# Patient Record
Sex: Female | Born: 1955
Health system: Southern US, Community
[De-identification: ages and names within clinical notes are randomized; demographics above are authoritative.]

## PROBLEM LIST (undated history)

## (undated) DIAGNOSIS — R413 Other amnesia: Secondary | ICD-10-CM

## (undated) HISTORY — DX: Other amnesia: R41.3

## (undated) HISTORY — PX: TONSILLECTOMY: SUR1361

## (undated) HISTORY — PX: TUBAL LIGATION: SHX77

---

## 2006-02-14 ENCOUNTER — Ambulatory Visit (HOSPITAL_COMMUNITY): Admission: RE | Admit: 2006-02-14 | Discharge: 2006-02-14 | Payer: Self-pay | Admitting: Obstetrics and Gynecology

## 2006-03-04 ENCOUNTER — Encounter: Admission: RE | Admit: 2006-03-04 | Discharge: 2006-03-04 | Payer: Self-pay | Admitting: Obstetrics and Gynecology

## 2007-02-18 ENCOUNTER — Ambulatory Visit (HOSPITAL_COMMUNITY): Admission: RE | Admit: 2007-02-18 | Discharge: 2007-02-18 | Payer: Self-pay | Admitting: Obstetrics and Gynecology

## 2008-02-22 ENCOUNTER — Ambulatory Visit (HOSPITAL_COMMUNITY): Admission: RE | Admit: 2008-02-22 | Discharge: 2008-02-22 | Payer: Self-pay | Admitting: Obstetrics and Gynecology

## 2008-04-05 ENCOUNTER — Encounter: Admission: RE | Admit: 2008-04-05 | Discharge: 2008-04-05 | Payer: Self-pay | Admitting: Obstetrics and Gynecology

## 2008-11-28 ENCOUNTER — Ambulatory Visit: Payer: Self-pay | Admitting: Vascular Surgery

## 2009-02-22 ENCOUNTER — Ambulatory Visit (HOSPITAL_COMMUNITY): Admission: RE | Admit: 2009-02-22 | Discharge: 2009-02-22 | Payer: Self-pay | Admitting: Obstetrics and Gynecology

## 2009-02-27 ENCOUNTER — Ambulatory Visit: Payer: Self-pay | Admitting: Vascular Surgery

## 2009-04-10 ENCOUNTER — Ambulatory Visit: Payer: Self-pay | Admitting: Vascular Surgery

## 2009-04-17 ENCOUNTER — Ambulatory Visit: Payer: Self-pay | Admitting: Vascular Surgery

## 2010-03-07 ENCOUNTER — Ambulatory Visit (HOSPITAL_COMMUNITY): Admission: RE | Admit: 2010-03-07 | Discharge: 2010-03-07 | Payer: Self-pay | Admitting: Obstetrics and Gynecology

## 2010-04-19 ENCOUNTER — Ambulatory Visit (HOSPITAL_COMMUNITY): Admission: RE | Admit: 2010-04-19 | Discharge: 2010-04-19 | Payer: Self-pay | Admitting: Obstetrics and Gynecology

## 2010-09-23 ENCOUNTER — Encounter: Payer: Self-pay | Admitting: Obstetrics and Gynecology

## 2011-01-15 NOTE — Assessment & Plan Note (Signed)
OFFICE VISIT   [place], Beckett R  DOB:  [birth year]                                       02/27/2009  ZOXWR#:[id number]   The patient returns today for further followup regarding her severe  venous insufficiency of both lower extremities.  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  She has been wearing long-leg elastic compression  stockings as well as elevating her legs as much as possible and taking  ibuprofen.  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  She continues to have symptoms which are  affecting her daily living and her ability to work.   PHYSICAL EXAMINATION:  She continues to have bulging varicosities in  both distal thigh and calf areas with 1+ edema distally and significant  hyperpigmentation and lipodermatosclerosis, particularly in the right  ankle.  Her duplex study done at last visit revealed gross reflux in  both great saphenous veins from the saphenofemoral junction to the knee  with normal deep systems.   I think we should proceed with (1) laser ablation of the right great  saphenous vein with multiple stab phlebectomies to be followed by (2)  laser ablation of the left great saphenous vein with multiple stab  phlebectomies to decrease her venous pressure and eliminate her  symptoms.  We will proceed with precertification for this procedure to  be done in the near future.   Quita Skye Hart Rochester, M.D.  Electronically Signed   JDL/MEDQ  D:  02/27/2009  T:  02/28/2009  Job:  [id number]

## 2011-01-15 NOTE — Procedures (Signed)
LOWER EXTREMITY VENOUS REFLUX EXAM   INDICATION:  Bilateral lower extremity varicose veins.   EXAM:  Using color-flow imaging and pulse Doppler spectral analysis, the  right and left common femoral, superficial femoral, popliteal, posterior  tibial, greater and lesser saphenous veins are evaluated.  There is  evidence suggesting deep venous insufficiency in the right and left  lower extremities.   The right and left saphenofemoral junctions are not competent.   The right and left GSV are not competent, with the caliber as described  below.   The right proximal short saphenous vein demonstrates competency.  The  left proximal short saphenous vein appears to be thrombosed.   GSV Diameter (used if found to be incompetent only)                                            Right    Left  Proximal Greater Saphenous Vein           0.78 cm  0.65 cm  Proximal-to-mid-thigh                     0.44 cm  0.48 cm  Mid thigh                                 0.45 cm  0.4 cm  Mid-distal thigh                          0.4 cm   0.4 cm  Distal thigh                              0.41 cm  0.56 cm  Knee                                      0.7 cm   0.21 cm   RIGHT MEDIAL BRANCH OF LEFT GREATER SAPHENOUS VEIN DISTAL THIGH:  0.37  cm   POPLITEAL LEVEL:  0.4 cm   IMPRESSION:  1. Right and left greater saphenous vein reflux is identified with the      caliber ranging from 0.4 cm to 0.8 cm from the knee to the groin on      the right, 0.65 cm to 0.21 cm on the left.  2. The right and left greater saphenous veins are not aneurysmal.  3. The right and left greater saphenous veins are not tortuous.  4. The deep venous system is not competent throughout the legs      bilaterally and appears somewhat pulsatile.  5. The left lesser saphenous vein is thrombosed.  6. At the distal thigh the left greater saphenous vein branches.  The      more posterior and medial branch is the larger and  straighter of      the two.   ___________________________________________  Quita Skye. Hart Rochester, M.D.   MC/MEDQ  D:  11/28/2008  T:  11/28/2008  Job:  [id number]

## 2011-01-15 NOTE — Consult Note (Signed)
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  She has  not had a stasis ulcer but the skin on the right ankle has changed  dramatically.  She has aching, burning and having discomfort in both  legs in the lower thigh and calves as the day progresses.  This can be  relieved by elevation but she is unable to elevate her legs since she  stands most of the day at work.  She does have swelling in the ankles  and foot as the day progresses, has no history of bleeding, stasis  ulcers, thrombophlebitis or deep venous thrombosis.  She has not worn  elastic compression stockings since her last pregnancy and does not take  pain medicine.   PAST MEDICAL HISTORY:  Negative for diabetes, hypertension, coronary  artery disease, COPD or stroke.   PAST SURGICAL HISTORY:  1. Tonsillectomy.  2. Tubal ligation.   FAMILY HISTORY:  Positive for hypertension in her mother, coronary  artery disease in her grandfather and 2 strokes in the other  grandfather.  Negative for diabetes.   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   REVIEW OF SYSTEMS:  Unremarkable.  No chest pain, dyspnea on exertion,  PND, orthopnea, has occasional joint pain in her knees, otherwise  negative.   ALLERGIES:  Sulfa.   PHYSICAL EXAMINATION:  Vital Signs:  Blood pressure 110/70, heart rate  68, respirations 14.  General:  She is a healthy middle-aged female in  no apparent distress, alert and oriented x3.  Neck:  Supple, 3+  carotid  pulses palpable.  No bruits are audible.  Neurologic:  Normal.  No  palpable adenopathy in the neck.  Chest:  Clear to auscultation.  Cardiovascular:  Regular rhythm, no murmurs.  Abdomen:  Soft, nontender  with no masses.  She has 3+ femoral, popliteal and distal pulses  bilaterally.  Both feet are well-perfused.  Right leg has severe venous  insufficiency of the greater saphenous system with varicosity in the  distal thigh but a large cluster in the medial calf over the greater  saphenous system with prominent reticular and spider veins extending  over the lower third of the leg into the ankle and foot.  She has  lipodermatosclerosis in the right ankle with skin changes and  hyperpigmentation but no active ulcer.  There is 1+ edema on the right  side.  Left leg has similar findings, but less severe, with prominent  varicosity in the left medial thigh distally in the left medial calf  with spider and reticular veins in the ankle region and some  hyperpigmentation.   Venous duplex exam in our office today reveals mild reflux in the deep  systems bilaterally.  Both great saphenous systems have diffuse reflux  from the saphenofemoral junction down to the knee with valvular  incompetence.  This patient does have severe symptoms of venous insufficiency.  We will  treat her initially with long-leg elastic compression stockings (20-mm -  30-mm), which we fitted her for today, as well as elevation of her legs  as much as possible and ibuprofen for discomfort.  I will see her in 3  months to see how this progresses.  If she has had no improvement, she  would be a good candidate for 1)  Laser ablation of the right great  saphenous vein with multiple stab phlebectomies, 2) Laser ablation of  the left great saphenous vein with multiple stab phlebectomies.   Quita Skye Hart Rochester, M.D.  Electronically Signed  JDL/MEDQ  D:  11/28/2008  T:  11/28/2008  Job:  [id number]   cc:   Kristen Norman,  M.D.

## 2011-01-15 NOTE — Procedures (Signed)
DUPLEX DEEP VENOUS EXAM - LOWER EXTREMITY    INDICATION:  Follow up right greater saphenous vein laser ablation.   HISTORY:  Edema:  No.  Trauma/Surgery:  Right greater saphenous vein ablation, 04/10/09 by Dr.  Hart Rochester.  Pain:  Right lower extremity tightness.  PE:  No.  Previous DVT:  No.  Anticoagulants:  No.  Other:   DUPLEX EXAM:                CFV   SFV   PopV   PTV   GSV                R  L  R  L  R  L   R  L  R  L  Thrombosis    o  o  o     o      o     +  Spontaneous   +  +  +     +      +     0  Phasic        +  +  +     +      +     0  Augmentation  +  +  +     +      +     0  Compressible  +  +  +     +      +     0  Competent     D  D  D     +      +     0   Legend:  + - yes  o - no  p - partial  D - decreased   IMPRESSION:  1. No evidence of deep venous thrombosis in the right lower extremity      or left common femoral vein.  2. Right greater saphenous vein shows evidence of ablation without      flow from groin to knee.    _____________________________  Quita Skye Hart Rochester, M.D.   AS/MEDQ  D:  04/17/2009  T:  04/17/2009  Job:  [id number]

## 2011-01-15 NOTE — Assessment & Plan Note (Signed)
OFFICE VISIT   Kristen Norman, Kristen Norman  DOB:  23-Mar-1956                                       04/17/2009  UYQIH#:[id number]   The patient had laser ablation of her right great saphenous vein with  multiple stab phlebectomies 1 week ago for painful varicosities in the  right thigh and calf and skin changes in the right ankle secondary to  venous hypertension.  She is done well, with some mild discomfort in the  thigh in the area of the laser ablation.  She did have some localized  swelling in the right medial calf a few days following her procedure  where a stab phlebectomy was performed, but that has resolved with  external compression.  On exam, the stab phlebectomy wounds are all  healing adequately and there is some resolving erythema in the thigh  overlying the right great saphenous vein.  Duplex scan today reveals  total occlusion of the left great saphenous vein from the saphenofemoral  junction to the knee with no DVT.  She is reassured regarding these  findings, and will schedule her contralateral left leg to be done in the  next several months.   Quita Skye Hart Rochester, M.D.  Electronically Signed   JDL/MEDQ  D:  04/17/2009  T:  04/18/2009  Job:  [id number]

## 2011-02-19 ENCOUNTER — Other Ambulatory Visit (HOSPITAL_COMMUNITY): Payer: Self-pay | Admitting: Obstetrics & Gynecology

## 2011-02-19 DIAGNOSIS — Z1231 Encounter for screening mammogram for malignant neoplasm of breast: Secondary | ICD-10-CM

## 2011-03-11 ENCOUNTER — Ambulatory Visit (HOSPITAL_COMMUNITY)
Admission: RE | Admit: 2011-03-11 | Discharge: 2011-03-11 | Disposition: A | Discharge status: Home / Self Care | Payer: BC Managed Care – PPO | Source: Ambulatory Visit | Attending: Obstetrics & Gynecology | Admitting: Obstetrics & Gynecology

## 2011-03-11 DIAGNOSIS — Z1231 Encounter for screening mammogram for malignant neoplasm of breast: Secondary | ICD-10-CM

## 2012-02-26 ENCOUNTER — Other Ambulatory Visit (HOSPITAL_COMMUNITY): Payer: Self-pay | Admitting: Obstetrics & Gynecology

## 2012-02-26 DIAGNOSIS — Z1231 Encounter for screening mammogram for malignant neoplasm of breast: Secondary | ICD-10-CM

## 2012-03-16 ENCOUNTER — Ambulatory Visit (HOSPITAL_COMMUNITY)
Admission: RE | Admit: 2012-03-16 | Discharge: 2012-03-16 | Disposition: A | Discharge status: Home / Self Care | Payer: BC Managed Care – PPO | Source: Ambulatory Visit | Attending: Obstetrics & Gynecology | Admitting: Obstetrics & Gynecology

## 2012-03-16 DIAGNOSIS — Z1231 Encounter for screening mammogram for malignant neoplasm of breast: Secondary | ICD-10-CM

## 2012-06-13 IMAGING — MG MM DIGITAL SCREENING
2 series · 2 of 2 positions shown · non-contrast
Comparison: none

DG SCREEN MAMMOGRAM BILATERAL
Bilateral CC and MLO view(s) were taken.
Technologist: Dionisia Yt, RT, RM

DIGITAL SCREENING MAMMOGRAM WITH CAD:
The breast tissue is heterogeneously dense.  No masses or malignant type calcifications are 
identified.  Compared with prior studies.
Images were processed with CAD.

[R CC]
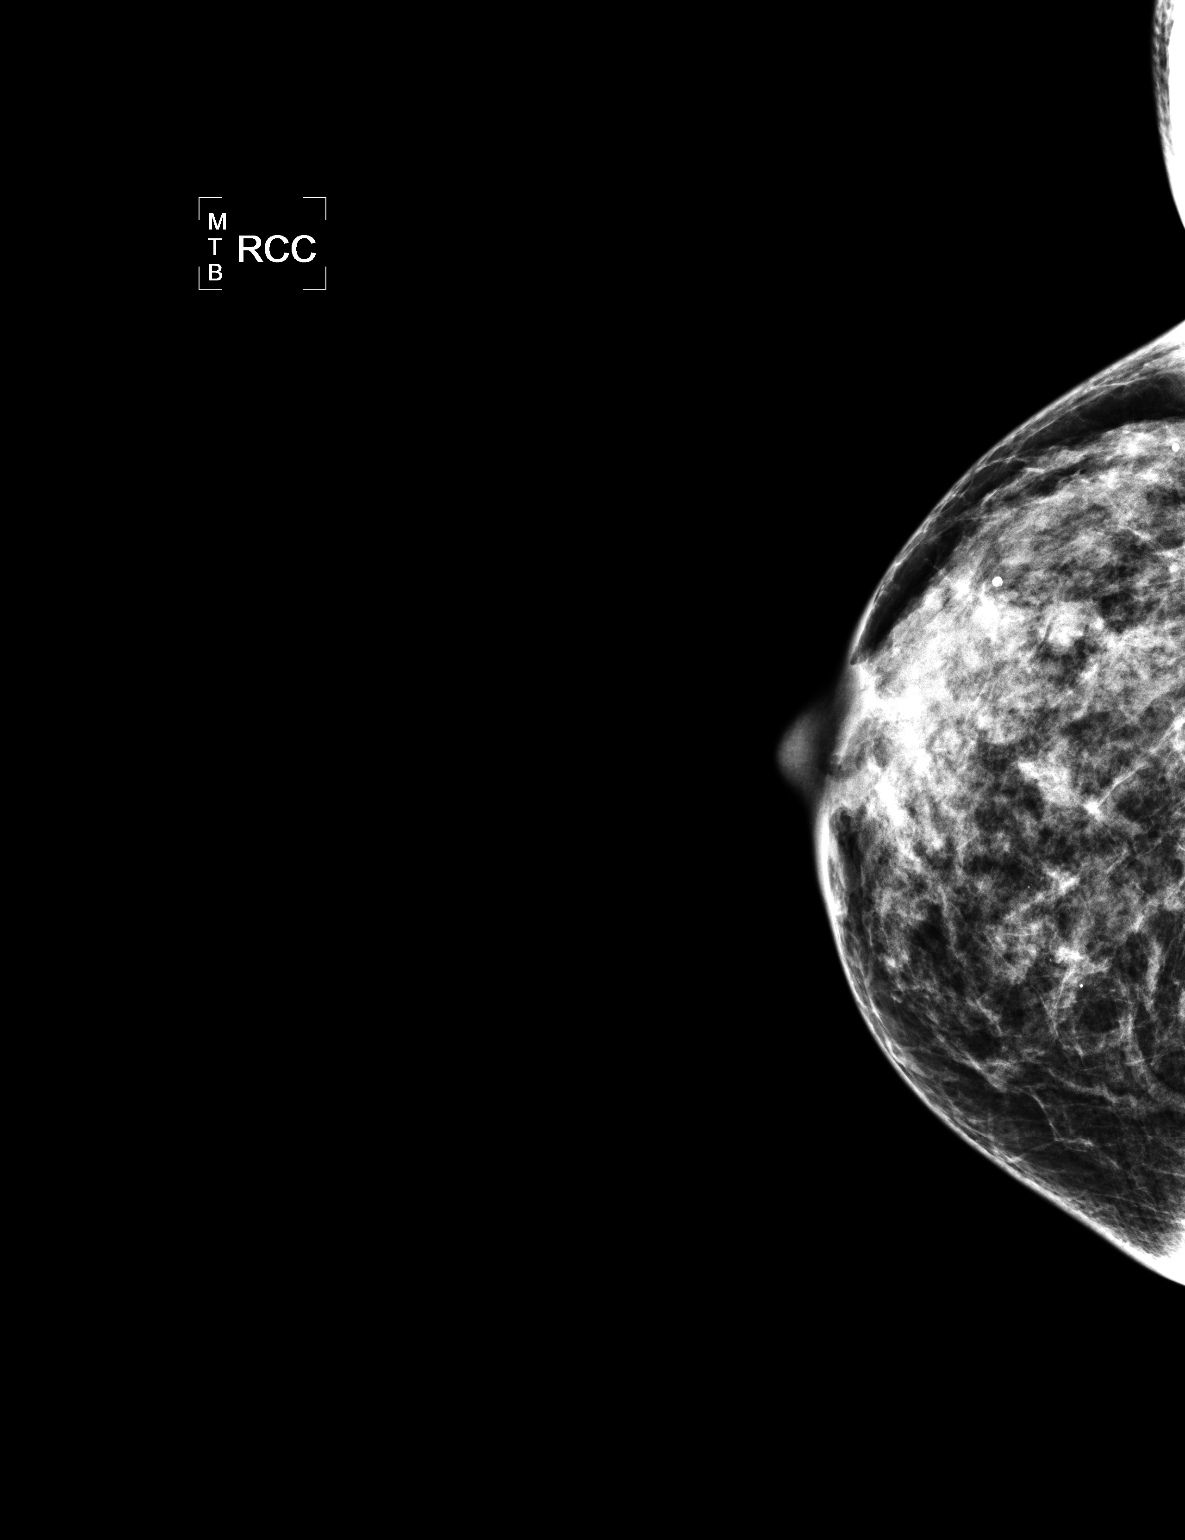

[R MLO]
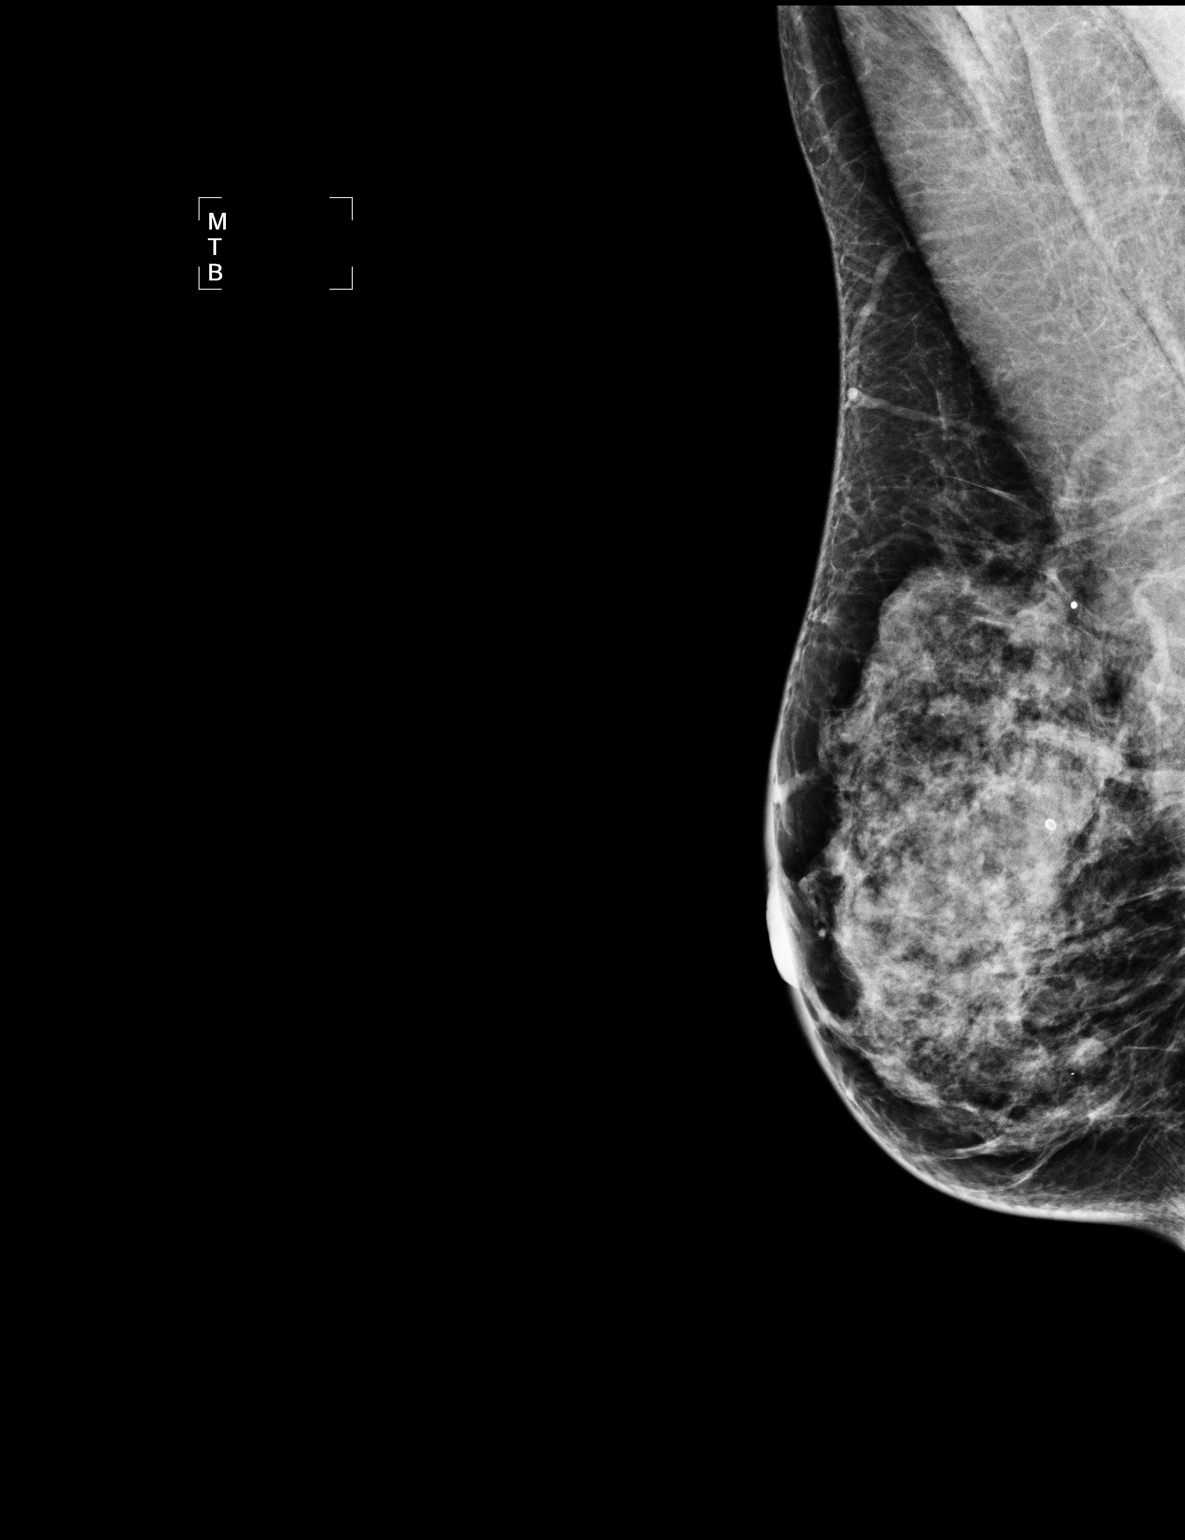

[2 of 2 positions shown; findings below may reference images not displayed]

IMPRESSION: No specific mammographic evidence of malignancy.  Next screening mammogram is recommended in one 
year.

A result letter of this screening mammogram will be mailed directly to the patient.

ASSESSMENT: Negative - BI-RADS 1

Screening mammogram in 1 year.
,

## 2013-02-11 ENCOUNTER — Other Ambulatory Visit (HOSPITAL_COMMUNITY): Payer: Self-pay | Admitting: Obstetrics & Gynecology

## 2013-02-11 DIAGNOSIS — Z1231 Encounter for screening mammogram for malignant neoplasm of breast: Secondary | ICD-10-CM

## 2013-03-29 ENCOUNTER — Ambulatory Visit (HOSPITAL_COMMUNITY)
Admission: RE | Admit: 2013-03-29 | Discharge: 2013-03-29 | Disposition: A | Discharge status: Home / Self Care | Payer: BC Managed Care – PPO | Source: Ambulatory Visit | Attending: Obstetrics & Gynecology | Admitting: Obstetrics & Gynecology

## 2013-03-29 DIAGNOSIS — Z1231 Encounter for screening mammogram for malignant neoplasm of breast: Secondary | ICD-10-CM | POA: Insufficient documentation

## 2013-12-31 ENCOUNTER — Other Ambulatory Visit: Payer: Self-pay | Admitting: Cardiology

## 2013-12-31 ENCOUNTER — Ambulatory Visit
Admission: RE | Admit: 2013-12-31 | Discharge: 2013-12-31 | Disposition: A | Discharge status: Home / Self Care | Payer: BC Managed Care – PPO | Source: Ambulatory Visit | Attending: Cardiology | Admitting: Cardiology

## 2013-12-31 DIAGNOSIS — R0789 Other chest pain: Secondary | ICD-10-CM

## 2014-02-16 ENCOUNTER — Other Ambulatory Visit (HOSPITAL_COMMUNITY): Payer: Self-pay | Admitting: Obstetrics & Gynecology

## 2014-02-16 DIAGNOSIS — Z1231 Encounter for screening mammogram for malignant neoplasm of breast: Secondary | ICD-10-CM

## 2014-03-31 ENCOUNTER — Ambulatory Visit (HOSPITAL_COMMUNITY)
Admission: RE | Admit: 2014-03-31 | Discharge: 2014-03-31 | Disposition: A | Discharge status: Home / Self Care | Payer: BC Managed Care – PPO | Source: Ambulatory Visit | Attending: Obstetrics & Gynecology | Admitting: Obstetrics & Gynecology

## 2014-03-31 DIAGNOSIS — Z1231 Encounter for screening mammogram for malignant neoplasm of breast: Secondary | ICD-10-CM | POA: Insufficient documentation

## 2015-03-15 ENCOUNTER — Other Ambulatory Visit (HOSPITAL_COMMUNITY): Payer: Self-pay | Admitting: Obstetrics & Gynecology

## 2015-03-15 DIAGNOSIS — Z1231 Encounter for screening mammogram for malignant neoplasm of breast: Secondary | ICD-10-CM

## 2015-04-03 ENCOUNTER — Ambulatory Visit (HOSPITAL_COMMUNITY)
Admission: RE | Admit: 2015-04-03 | Discharge: 2015-04-03 | Disposition: A | Discharge status: Home / Self Care | Payer: BC Managed Care – PPO | Source: Ambulatory Visit | Attending: Obstetrics & Gynecology | Admitting: Obstetrics & Gynecology

## 2015-04-03 DIAGNOSIS — Z1231 Encounter for screening mammogram for malignant neoplasm of breast: Secondary | ICD-10-CM

## 2017-03-26 ENCOUNTER — Other Ambulatory Visit: Payer: Self-pay | Admitting: Obstetrics & Gynecology

## 2017-03-26 DIAGNOSIS — Z1231 Encounter for screening mammogram for malignant neoplasm of breast: Secondary | ICD-10-CM

## 2017-04-14 ENCOUNTER — Telehealth: Payer: Self-pay | Admitting: *Deleted

## 2017-04-14 ENCOUNTER — Encounter: Payer: Self-pay | Admitting: Obstetrics & Gynecology

## 2017-04-14 DIAGNOSIS — Z131 Encounter for screening for diabetes mellitus: Secondary | ICD-10-CM

## 2017-04-14 DIAGNOSIS — Z13220 Encounter for screening for lipoid disorders: Secondary | ICD-10-CM

## 2017-04-14 DIAGNOSIS — Z1321 Encounter for screening for nutritional disorder: Secondary | ICD-10-CM

## 2017-04-14 DIAGNOSIS — Z1329 Encounter for screening for other suspected endocrine disorder: Secondary | ICD-10-CM

## 2017-04-14 DIAGNOSIS — Z01419 Encounter for gynecological examination (general) (routine) without abnormal findings: Secondary | ICD-10-CM

## 2017-04-14 NOTE — Telephone Encounter (Signed)
Pt coming on in am at 8:45am, order placed. Pt will be fasting.

## 2017-04-14 NOTE — Telephone Encounter (Signed)
Pt has annual exam tomorrow at 2pm wanted to have labs done in am for fasting you ordered Hemoglobin A1C, CMP, CBC,FLP, Vitamin D, TSH last year, okay to order same this year? Or wait until annual visit. Please advise

## 2017-04-14 NOTE — Telephone Encounter (Signed)
Yes agree with same labs.

## 2017-04-15 ENCOUNTER — Other Ambulatory Visit: Payer: BC Managed Care – PPO

## 2017-04-15 ENCOUNTER — Ambulatory Visit
Admission: RE | Admit: 2017-04-15 | Discharge: 2017-04-15 | Disposition: A | Discharge status: Home / Self Care | Payer: BC Managed Care – PPO | Source: Ambulatory Visit | Attending: Obstetrics & Gynecology | Admitting: Obstetrics & Gynecology

## 2017-04-15 ENCOUNTER — Ambulatory Visit (INDEPENDENT_AMBULATORY_CARE_PROVIDER_SITE_OTHER): Payer: BC Managed Care – PPO | Admitting: Obstetrics & Gynecology

## 2017-04-15 ENCOUNTER — Encounter: Payer: Self-pay | Admitting: Obstetrics & Gynecology

## 2017-04-15 VITALS — BP 112/70 | Ht 65.0 in | Wt 115.0 lb

## 2017-04-15 DIAGNOSIS — Z01419 Encounter for gynecological examination (general) (routine) without abnormal findings: Secondary | ICD-10-CM

## 2017-04-15 DIAGNOSIS — Z780 Asymptomatic menopausal state: Secondary | ICD-10-CM

## 2017-04-15 DIAGNOSIS — N393 Stress incontinence (female) (male): Secondary | ICD-10-CM

## 2017-04-15 DIAGNOSIS — Z1151 Encounter for screening for human papillomavirus (HPV): Secondary | ICD-10-CM

## 2017-04-15 DIAGNOSIS — Z1231 Encounter for screening mammogram for malignant neoplasm of breast: Secondary | ICD-10-CM

## 2017-04-15 DIAGNOSIS — Z1329 Encounter for screening for other suspected endocrine disorder: Secondary | ICD-10-CM

## 2017-04-15 DIAGNOSIS — Z1321 Encounter for screening for nutritional disorder: Secondary | ICD-10-CM

## 2017-04-15 DIAGNOSIS — Z131 Encounter for screening for diabetes mellitus: Secondary | ICD-10-CM

## 2017-04-15 DIAGNOSIS — Z13220 Encounter for screening for lipoid disorders: Secondary | ICD-10-CM

## 2017-04-15 LAB — CBC WITH DIFFERENTIAL/PLATELET
Basophils Absolute: 44 cells/uL (ref 0–200)
Basophils Relative: 1 %
Eosinophils Absolute: 220 cells/uL (ref 15–500)
Eosinophils Relative: 5 %
HEMATOCRIT: 41.4 % (ref 35.0–45.0)
Hemoglobin: 13.8 g/dL (ref 11.7–15.5)
LYMPHS ABS: 1144 {cells}/uL (ref 850–3900)
LYMPHS PCT: 26 %
MCH: 30.3 pg (ref 27.0–33.0)
MCHC: 33.3 g/dL (ref 32.0–36.0)
MCV: 91 fL (ref 80.0–100.0)
MONO ABS: 264 {cells}/uL (ref 200–950)
MPV: 9.2 fL (ref 7.5–12.5)
Monocytes Relative: 6 %
NEUTROS ABS: 2728 {cells}/uL (ref 1500–7800)
Neutrophils Relative %: 62 %
Platelets: 293 10*3/uL (ref 140–400)
RBC: 4.55 MIL/uL (ref 3.80–5.10)
RDW: 14.1 % (ref 11.0–15.0)
WBC: 4.4 10*3/uL (ref 3.8–10.8)

## 2017-04-15 LAB — COMPREHENSIVE METABOLIC PANEL
ALK PHOS: 60 U/L (ref 33–130)
ALT: 15 U/L (ref 6–29)
AST: 20 U/L (ref 10–35)
Albumin: 4.7 g/dL (ref 3.6–5.1)
BILIRUBIN TOTAL: 0.6 mg/dL (ref 0.2–1.2)
BUN: 15 mg/dL (ref 7–25)
CALCIUM: 9.3 mg/dL (ref 8.6–10.4)
CO2: 26 mmol/L (ref 20–32)
Chloride: 102 mmol/L (ref 98–110)
Creat: 0.74 mg/dL (ref 0.50–0.99)
GLUCOSE: 86 mg/dL (ref 65–99)
Potassium: 4.2 mmol/L (ref 3.5–5.3)
Sodium: 140 mmol/L (ref 135–146)
Total Protein: 7.2 g/dL (ref 6.1–8.1)

## 2017-04-15 LAB — LIPID PANEL
Cholesterol: 212 mg/dL — ABNORMAL HIGH (ref <200)
HDL: 79 mg/dL (ref >50)
LDL Cholesterol: 120 mg/dL — ABNORMAL HIGH (ref <100)
Total CHOL/HDL Ratio: 2.7 Ratio (ref <5.0)
Triglycerides: 67 mg/dL (ref <150)
VLDL: 13 mg/dL (ref <30)

## 2017-04-15 LAB — TSH: TSH: 2.3 mIU/L

## 2017-04-15 NOTE — Patient Instructions (Signed)
1. Encounter for routine gynecological examination with Papanicolaou smear of cervix Normal gyn exam for Menopause.  Pap/HPV HR done.  Breasts wnl.  Mammo done this am and fasting labs.  Will schedule Colonoscopy.  2. Menopause present No HRT.  No PMB.  Vit D supplement, Ca++ in nutrition, weight bearing physical activity. Will f/u Dexa here. - DG Bone Density; Future  3. SUI (stress urinary incontinence, female) Regular emptying of bladder and Kegels recommended.  Santina EvansCatherine, it was a pleasure to see you today!  I will inform you of all your results as soon as available.     Kegel Exercises Kegel exercises help strengthen the muscles that support the rectum, vagina, small intestine, bladder, and uterus. Doing Kegel exercises can help:  Improve bladder and bowel control.  Improve sexual response.  Reduce problems and discomfort during pregnancy.  Kegel exercises involve squeezing your pelvic floor muscles, which are the same muscles you squeeze when you try to stop the flow of urine. The exercises can be done while sitting, standing, or lying down, but it is best to vary your position. Phase 1 exercises 1. Squeeze your pelvic floor muscles tight. You should feel a tight lift in your rectal area. If you are a female, you should also feel a tightness in your vaginal area. Keep your stomach, buttocks, and legs relaxed. 2. Hold the muscles tight for up to 10 seconds. 3. Relax your muscles. Repeat this exercise 50 times a day or as many times as told by your health care provider. Continue to do this exercise for at least 4-6 weeks or for as long as told by your health care provider. This information is not intended to replace advice given to you by your health care provider. Make sure you discuss any questions you have with your health care provider. Document Released: 08/05/2012 Document Revised: 04/13/2016 Document Reviewed: 07/09/2015 Elsevier Interactive Patient Education  [employer]2018 Elsevier  Inc.

## 2017-04-15 NOTE — Addendum Note (Signed)
Addended by: Berna SpareASTILLO, Lizmary Nader A on: 04/15/2017 02:57 PM   Modules accepted: Orders

## 2017-04-15 NOTE — Progress Notes (Signed)
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  RP:  Established patient presenting for annual gyn exam   HPI:  Menopause.  No HRT.  No PMB.  No pelvic pain.  Not sexually active, husband with DM.  Breasts wnl.  Mild SUI, helped by emptying her bladder regularly.  BMs wnl.  Fasting labs done this am here.  Dr Jama Flavorsankin Fam MD.  Past medical history,surgical history, family history and social history were all reviewed and documented in the EPIC chart.  Gynecologic History No LMP recorded. Patient is postmenopausal. Contraception: post menopausal status Last Pap: 2016. Results were: neg/HPV HR neg Last mammogram: 04/15/2017 today. Results were: pending Bone Density >2 yrs Colonoscopy Never  Obstetric History OB History  Gravida Para Term Preterm AB Living  3 3       3   SAB TAB Ectopic Multiple Live Births               # Outcome Date GA Lbr Len/2nd Weight Sex Delivery Anes PTL Lv  3 Para           2 Para           1 Para                ROS: A ROS was performed and pertinent positives and negatives are included in the history.  GENERAL: No fevers or chills. HEENT: No change in vision, no earache, sore throat or sinus congestion. NECK: No pain or stiffness. CARDIOVASCULAR: No chest pain or pressure. No palpitations. PULMONARY: No shortness of breath, cough or wheeze. GASTROINTESTINAL: No abdominal pain, nausea, vomiting or diarrhea, melena or bright red blood per rectum. GENITOURINARY: No urinary frequency, urgency, hesitancy or dysuria. MUSCULOSKELETAL: No joint or muscle pain, no back pain, no recent trauma. DERMATOLOGIC: No rash, no itching, no lesions. ENDOCRINE: No polyuria, polydipsia, no heat or cold intolerance. No recent change in weight. HEMATOLOGICAL: No anemia or easy bruising or bleeding. NEUROLOGIC: No headache, seizures, numbness, tingling or weakness.  PSYCHIATRIC: No depression, no loss of interest in normal activity or change in sleep pattern.     Exam:   BP 112/70   Ht 5\' 5"  (1.651 m)   Wt 115 lb (52.2 kg)   BMI 19.14 kg/m   Body mass index is 19.14 kg/m.  General appearance : Well developed well nourished female. No acute distress HEENT: Eyes: no retinal hemorrhage or exudates,  Neck supple, trachea midline, no carotid bruits, no thyroidmegaly Lungs: Clear to auscultation, no rhonchi or wheezes, or rib retractions  Heart: Regular rate and rhythm, no murmurs or gallops Breast:Examined in sitting and supine position were symmetrical in appearance, no palpable masses or tenderness,  no skin retraction, no nipple inversion, no nipple discharge, no skin discoloration, no axillary or supraclavicular lymphadenopathy Abdomen: no palpable masses or tenderness, no rebound or guarding Extremities: no edema or skin discoloration or tenderness  Pelvic:  Bartholin, Urethra, Skene Glands: Within normal limits             Vagina: No gross lesions or discharge  Cervix: No gross lesions or discharge.  Pap/HPV HR done.  Uterus  AV, normal size, shape and consistency, non-tender and mobile  Adnexa  Without masses or tenderness  Anus and perineum  normal    Assessment/Plan:  61 y.o. female for annual exam   1. Encounter for routine gynecological examination with Papanicolaou  smear of cervix Normal gyn exam for Menopause.  Pap/HPV HR done.  Breasts wnl.  Mammo done this am.  Will schedule Colonoscopy.  2. Menopause present No HRT.  No PMB.  Vit D supplement, Ca++ in nutrition, weight bearing physical activity. Will f/u Dexa here. - DG Bone Density; Future  3. SUI (stress urinary incontinence, female) Regular emptying of bladder and Kegels recommended.  Counseling on above issues >50% x 10 minutes.  Genia Del MD, 2:16 PM 04/15/2017

## 2017-04-16 ENCOUNTER — Telehealth: Payer: Self-pay | Admitting: *Deleted

## 2017-04-16 DIAGNOSIS — Z1211 Encounter for screening for malignant neoplasm of colon: Secondary | ICD-10-CM

## 2017-04-16 LAB — VITAMIN D 25 HYDROXY (VIT D DEFICIENCY, FRACTURES): VIT D 25 HYDROXY: 45 ng/mL (ref 30–100)

## 2017-04-16 LAB — HEMOGLOBIN A1C
Hgb A1c MFr Bld: 5.3 % (ref <5.7)
MEAN PLASMA GLUCOSE: 105 mg/dL

## 2017-04-16 NOTE — Telephone Encounter (Signed)
Referral placed they will contact pt to schedule. 

## 2017-04-16 NOTE — Telephone Encounter (Signed)
-----   Message from Genia DelMarie-Lyne Lavoie, MD sent at 04/15/2017  2:46 PM EDT ----- Schedule screening Colonoscopy asap, never done.

## 2017-04-17 LAB — PAP, TP IMAGING W/ HPV RNA, RFLX HPV TYPE 16,18/45: HPV MRNA, HIGH RISK: NOT DETECTED

## 2017-04-29 ENCOUNTER — Other Ambulatory Visit: Payer: Self-pay | Admitting: Gynecology

## 2017-04-29 DIAGNOSIS — Z13820 Encounter for screening for osteoporosis: Secondary | ICD-10-CM

## 2017-04-29 DIAGNOSIS — Z780 Asymptomatic menopausal state: Secondary | ICD-10-CM

## 2017-05-06 NOTE — Telephone Encounter (Signed)
[facility] called and left message for pt to call them. Pt aware of need of colonoscopy this encounter will be closed.

## 2017-05-12 ENCOUNTER — Ambulatory Visit (INDEPENDENT_AMBULATORY_CARE_PROVIDER_SITE_OTHER): Payer: BC Managed Care – PPO

## 2017-05-12 ENCOUNTER — Other Ambulatory Visit: Payer: Self-pay | Admitting: Gynecology

## 2017-05-12 DIAGNOSIS — M810 Age-related osteoporosis without current pathological fracture: Secondary | ICD-10-CM | POA: Diagnosis not present

## 2017-05-12 DIAGNOSIS — Z13820 Encounter for screening for osteoporosis: Secondary | ICD-10-CM | POA: Diagnosis not present

## 2017-05-12 DIAGNOSIS — Z780 Asymptomatic menopausal state: Secondary | ICD-10-CM

## 2017-05-13 ENCOUNTER — Telehealth: Payer: Self-pay | Admitting: Gynecology

## 2017-05-13 NOTE — Telephone Encounter (Signed)
Left detailed message on pt voicemail per DPR access 

## 2017-05-13 NOTE — Telephone Encounter (Signed)
Tell patient her most recent bone density shows osteoporosis. Recommend office visit with Dr Lavoie to discuss treatment options 

## 2017-05-20 ENCOUNTER — Encounter: Payer: Self-pay | Admitting: Obstetrics & Gynecology

## 2017-06-10 ENCOUNTER — Ambulatory Visit (INDEPENDENT_AMBULATORY_CARE_PROVIDER_SITE_OTHER): Payer: BC Managed Care – PPO | Admitting: Obstetrics & Gynecology

## 2017-06-10 ENCOUNTER — Encounter: Payer: Self-pay | Admitting: Obstetrics & Gynecology

## 2017-06-10 VITALS — BP 140/80

## 2017-06-10 DIAGNOSIS — M810 Age-related osteoporosis without current pathological fracture: Secondary | ICD-10-CM

## 2017-06-10 DIAGNOSIS — Z23 Encounter for immunization: Secondary | ICD-10-CM

## 2017-06-10 NOTE — Progress Notes (Signed)
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  RP:  Discuss Bone Density results  HPI:  No change x last visit:  Menopause.  No HRT.  No PMB.  No pelvic pain.  Not sexually active, husband with DM.  Breasts wnl.  Mild SUI, helped by emptying her bladder regularly.  BMs wnl.  Fasting labs done this am here.  Dr Jama Flavors MD.  BMI 19.14.  Exercises regularly.   Past medical history,surgical history, problem list, medications, allergies, family history and social history were all reviewed and documented in the EPIC chart.  Directed ROS with pertinent positives and negatives documented in the history of present illness/assessment and plan.  Exam:  Vitals:   06/10/17 1610  BP: 140/80   General appearance:  Normal  Bone Density 05/12/2017:  Rt femoral neck T-score -2.5 and Lt UD forearm T-score -2.5.  Other sites in Osteopenic range.  Lumbar not done re Scoliosis.  Labs: Results for Norman, Kristen R (MRN [id number]) as of 06/14/2017 13:56  Ref. Range 04/15/2017 09:02 04/15/2017 15:18  COMPREHENSIVE METABOLIC PANEL Unknown Rpt   Sodium Latest Ref Range: 135 - 146 mmol/L 140   Potassium Latest Ref Range: 3.5 - 5.3 mmol/L 4.2   Chloride Latest Ref Range: 98 - 110 mmol/L 102   CO2 Latest Ref Range: 20 - 32 mmol/L 26   Glucose Latest Ref Range: 65 - 99 mg/dL 86   Mean Plasma Glucose Latest Units: mg/dL 782   BUN Latest Ref Range: 7 - 25 mg/dL 15   Creatinine Latest Ref Range: 0.50 - 0.99 mg/dL 9.56   Calcium Latest Ref Range: 8.6 - 10.4 mg/dL 9.3   Alkaline Phosphatase Latest Ref Range: 33 - 130 U/L 60   Albumin Latest Ref Range: 3.6 - 5.1 g/dL 4.7   AST Latest Ref Range: 10 - 35 U/L 20   ALT Latest Ref Range: 6 - 29 U/L 15   Total Protein Latest Ref Range: 6.1 - 8.1 g/dL 7.2   Total Bilirubin Latest Ref Range: 0.2 - 1.2 mg/dL 0.6   Total CHOL/HDL Ratio Latest Ref Range:  <5.0 Ratio 2.7   Cholesterol Latest Ref Range: <200 mg/dL 213 (H)   HDL Cholesterol Latest Ref Range: >50 mg/dL 79   LDL (calc) Latest Ref Range: <100 mg/dL 086 (H)   Triglycerides Latest Ref Range: <150 mg/dL 67   VLDL Latest Ref Range: <30 mg/dL 13   Vitamin D, 57-QIONGEX Latest Ref Range: 30 - 100 ng/mL 45   WBC Latest Ref Range: 3.8 - 10.8 K/uL 4.4   RBC Latest Ref Range: 3.80 - 5.10 MIL/uL 4.55   Hemoglobin Latest Ref Range: 11.7 - 15.5 g/dL 52.8   HCT Latest Ref Range: 35.0 - 45.0 % 41.4   MCV Latest Ref Range: 80.0 - 100.0 fL 91.0   MCH Latest Ref Range: 27.0 - 33.0 pg 30.3   MCHC Latest Ref Range: 32.0 - 36.0 g/dL 41.3   RDW Latest Ref Range: 11.0 - 15.0 % 14.1   Platelets Latest Ref Range: 140 - 400 K/uL 293   MPV Latest Ref Range: 7.5 - 12.5 fL 9.2   Neutrophils Latest Units: % 62   Lymphocytes Latest Units: % 26   Monocytes Relative Latest Units: % 6   Eosinophil Latest Units: % 5   Basophil Latest Units: % 1   NEUT# Latest Ref Range:  1,500 - 7,800 cells/uL 2,728   Lymphocyte # Latest Ref Range: 850 - 3,900 cells/uL 1,144   Monocyte # Latest Ref Range: 200 - 950 cells/uL 264   Eosinophils Absolute Latest Ref Range: 15 - 500 cells/uL 220   Basophils Absolute Latest Ref Range: 0 - 200 cells/uL 44   Smear Review Unknown Criteria for revi...   Hemoglobin A1C Latest Ref Range: <5.7 % 5.3   TSH Latest Units: mIU/L 2.30   HPV mRNA, High Risk Unknown  Not Detected  Comments Unknown  SEE NOTE  Cytotechnologist: Unknown  SEE NOTE  Final Diagnosis Unknown  SEE NOTE  PAP, TP IMAGING W/ HPV RNA, RFLX HPV TYPE 16,18/45 Unknown  Rpt    Assessment/Plan:  61 y.o. G3P3  1. Age-related osteoporosis without current pathological fracture Caucasian with BMI at 19.14 who exercises regularly.  Menopausal on no HRT.  Vit D at 45 normal.  Good nutrition with Ca++.  Borderline Osteoporosis with T score of -2.5.  Treatment with Prolia recommended given her risk factors and young age.  Usage  and risks of treatment as well as risks of progressive Osteoporosis reviewed.  Patient will think about it and verify insurance coverage and call back when ready to start treatment.  2. Need for diphtheria-tetanus-pertussis (Tdap) vaccine  - Tdap vaccine greater than or equal to 7yo IM   Counseling on above issues >50% x 25 minutes  Genia Del MD, 4:47 PM 06/10/2017

## 2017-06-14 NOTE — Patient Instructions (Signed)
1. Age-related osteoporosis without current pathological fracture Caucasian with BMI at 19.14 who exercises regularly.  Menopausal on no HRT.  Vit D at 44 normal.  Good nutrition with Ca++.  Borderline Osteoporosis with T score of -2.5.  Treatment with Prolia recommended given her risk factors and young age.  Usage and risks of treatment as well as risks of progressive Osteoporosis reviewed.  Patient will think about it and verify insurance coverage and call back when ready to start treatment.  2. Need for diphtheria-tetanus-pertussis (Tdap) vaccine  - Tdap vaccine greater than or equal to 7yo IM  Kristen Norman, it was good to see you today!   Osteoporosis Osteoporosis is the thinning and loss of density in the bones. Osteoporosis makes the bones more brittle, fragile, and likely to break (fracture). Over time, osteoporosis can cause the bones to become so weak that they fracture after a simple fall. The bones most likely to fracture are the bones in the hip, wrist, and spine. What are the causes? The exact cause is not known. What increases the risk? Anyone can develop osteoporosis. You may be at greater risk if you have a family history of the condition or have poor nutrition. You may also have a higher risk if you are:  Female.  40 years old or older.  A smoker.  Not physically active.  White or Asian.  Slender.  What are the signs or symptoms? A fracture might be the first sign of the disease, especially if it results from a fall or injury that would not usually cause a bone to break. Other signs and symptoms include:  Low back and neck pain.  Stooped posture.  Height loss.  How is this diagnosed? To make a diagnosis, your health care provider may:  Take a medical history.  Perform a physical exam.  Order tests, such as: ? A bone mineral density test. ? A dual-energy X-ray absorptiometry test.  How is this treated? The goal of osteoporosis treatment is to strengthen  your bones to reduce your risk of a fracture. Treatment may involve:  Making lifestyle changes, such as: ? Eating a diet rich in calcium. ? Doing weight-bearing and muscle-strengthening exercises. ? Stopping tobacco use. ? Limiting alcohol intake.  Taking medicine to slow the process of bone loss or to increase bone density.  Monitoring your levels of calcium and vitamin D.  Follow these instructions at home:  Include calcium and vitamin D in your diet. Calcium is important for bone health, and vitamin D helps the body absorb calcium.  Perform weight-bearing and muscle-strengthening exercises as directed by your health care provider.  Do not use any tobacco products, including cigarettes, chewing tobacco, and electronic cigarettes. If you need help quitting, ask your health care provider.  Limit your alcohol intake.  Take medicines only as directed by your health care provider.  Keep all follow-up visits as directed by your health care provider. This is important.  Take precautions at home to lower your risk of falling, such as: ? Keeping rooms well lit and clutter free. ? Installing safety rails on stairs. ? Using rubber mats in the bathroom and other areas that are often wet or slippery. Get help right away if: You fall or injure yourself. This information is not intended to replace advice given to you by your health care provider. Make sure you discuss any questions you have with your health care provider. Document Released: 05/29/2005 Document Revised: 01/22/2016 Document Reviewed: 01/27/2014 Elsevier Interactive Patient Education  2017 Elsevier Inc.  

## 2018-03-16 ENCOUNTER — Other Ambulatory Visit: Payer: Self-pay | Admitting: Obstetrics & Gynecology

## 2018-03-16 DIAGNOSIS — Z1231 Encounter for screening mammogram for malignant neoplasm of breast: Secondary | ICD-10-CM

## 2018-04-20 ENCOUNTER — Ambulatory Visit
Admission: RE | Admit: 2018-04-20 | Discharge: 2018-04-20 | Disposition: A | Discharge status: Home / Self Care | Payer: BC Managed Care – PPO | Source: Ambulatory Visit | Attending: Obstetrics & Gynecology | Admitting: Obstetrics & Gynecology

## 2018-04-20 DIAGNOSIS — Z1231 Encounter for screening mammogram for malignant neoplasm of breast: Secondary | ICD-10-CM

## 2019-06-01 ENCOUNTER — Encounter: Payer: Self-pay | Admitting: Gynecology

## 2020-04-06 ENCOUNTER — Ambulatory Visit: Payer: BC Managed Care – PPO | Admitting: Neurology

## 2020-04-06 ENCOUNTER — Other Ambulatory Visit: Payer: Self-pay

## 2020-04-06 ENCOUNTER — Encounter: Payer: Self-pay | Admitting: Neurology

## 2020-04-06 VITALS — BP 128/84 | HR 92 | Ht 65.0 in | Wt 115.5 lb

## 2020-04-06 DIAGNOSIS — G3184 Mild cognitive impairment, so stated: Secondary | ICD-10-CM

## 2020-04-06 NOTE — Progress Notes (Signed)
HISTORICAL  Kristen Norman is a 64 year old female, seen in request by primary care nurse practitioner Ayesha Rumpf for evaluation of memory loss, initial evaluation was on April 06, 2020 with her husband  I reviewed and summarized the referring note. 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, is very good with numbers, both her parents suffer dementia in their elderly age  Around 2020, she was noted to have gradual onset of memory loss, is difficult for her to focus to get her measurement right, because of that, it is very hesitant for her to pick up a new project, tends to repeat her stories, she still driving, keep her hospital can balance without much difficulty, she is a self aware of her difficulties, become sensitive,  Her memory symptoms is also noted by her daughter, and her college roommates  REVIEW OF SYSTEMS: Full 14 system review of systems performed and notable only for as above All other review of systems were negative.  ALLERGIES: Allergies  Allergen Reactions  . Sulfa Antibiotics Hives    HOME MEDICATIONS: No current outpatient medications on file.   No current facility-administered medications for this visit.    PAST MEDICAL HISTORY: Past Medical History:  Diagnosis Date  . Memory changes     PAST SURGICAL HISTORY: Past Surgical History:  Procedure Laterality Date  . TUBAL LIGATION      FAMILY HISTORY: Family History  Problem Relation Age of Onset  . Breast cancer Mother        2009  . Hypertension Mother   . Hypertension Maternal Grandmother   . Heart attack Paternal Grandfather   . Diabetes Paternal Grandfather   . Dementia Father     SOCIAL HISTORY: Social History   Socioeconomic History  . Marital status: Married    Spouse name: Not on file  . Number of children: 3  . Years of education: college  . Highest education level: Not on file  Occupational History    . Occupation: retired   Tobacco Use  . Smoking status: Never Smoker  . Smokeless tobacco: Never Used  Vaping Use  . Vaping Use: Never used  Substance and Sexual Activity  . Alcohol use: Yes    Comment: occasional  . Drug use: Never  . Sexual activity: Not Currently    Comment: 1st intercourse- 20's, partners- 1, married- 30   Other Topics Concern  . Not on file  Social History Narrative   Lives with husband.   Right-handed.   Occasional use of caffeine.   Social Determinants of Health   Financial Resource Strain:   . Difficulty of Paying Living Expenses:   Food Insecurity:   . Worried About Programme researcher, broadcasting/film/video in the Last Year:   . Barista in the Last Year:   Transportation Needs:   . Freight forwarder (Medical):   Marland Kitchen Lack of Transportation (Non-Medical):   Physical Activity:   . Days of Exercise per Week:   . Minutes of Exercise per Session:   Stress:   . Feeling of Stress :   Social Connections:   . Frequency of Communication with Friends and Family:   . Frequency of Social Gatherings with Friends and Family:   . Attends Religious Services:   . Active Member of Clubs or Organizations:   . Attends Banker Meetings:   Marland Kitchen Marital Status:   Intimate Partner Violence:   .  Fear of Current or Ex-Partner:   . Emotionally Abused:   Marland Kitchen Physically Abused:   . Sexually Abused:      PHYSICAL EXAM   Vitals:   04/06/20 0945  BP: 128/84  Pulse: 92  Weight: 115 lb 8 oz (52.4 kg)  Height: 5\' 5"  (1.651 m)   Not recorded     Body mass index is 19.22 kg/m.  PHYSICAL EXAMNIATION:  Gen: NAD, conversant, well nourised, well groomed                     Cardiovascular: Regular rate rhythm, no peripheral edema, warm, nontender. Eyes: Conjunctivae clear without exudates or hemorrhage Neck: Supple, no carotid bruits. Pulmonary: Clear to auscultation bilaterally   NEUROLOGICAL EXAM:  MENTAL STATUS: MMSE - Mini Mental State Exam 04/06/2020   Orientation to time 5  Orientation to Place 5  Registration 3  Attention/ Calculation 5  Recall 1  Language- name 2 objects 2  Language- repeat 1  Language- follow 3 step command 3  Language- read & follow direction 1  Write a sentence 1  Copy design 1  Total score 28  animal naming   CRANIAL NERVES: CN II: Visual fields are full to confrontation. Pupils are round equal and briskly reactive to light. CN III, IV, VI: extraocular movement are normal. No ptosis. CN V: Facial sensation is intact to light touch CN VII: Face is symmetric with normal eye closure  CN VIII: Hearing is normal to causal conversation. CN IX, X: Phonation is normal. CN XI: Head turning and shoulder shrug are intact  MOTOR: There is no pronator drift of out-stretched arms. Muscle bulk and tone are normal. Muscle strength is normal.  REFLEXES: Reflexes are 2+ and symmetric at the biceps, triceps, knees, and ankles. Plantar responses are flexor.  SENSORY: Intact to light touch, pinprick and vibratory sensation are intact in fingers and toes.  COORDINATION: There is no trunk or limb dysmetria noted.  GAIT/STANCE: Posture is normal. Gait is steady with normal steps, base, arm swing, and turning. Heel and toe walking are normal. Tandem gait is normal.  Romberg is absent.   DIAGNOSTIC DATA (LABS, IMAGING, TESTING) - I reviewed patient records, labs, notes, testing and imaging myself where available.   ASSESSMENT AND PLAN  Kinberly R 06/06/2020 is a 64 y.o. female   Mild Cognitive Impairment  Mini-Mental Status Examination was 28/30, missed 2 out of 3 recall  MRI of brain to rule out structural lesion  Laboratory evaluation to rule out treatable etiology  Return to clinic in 3 to 4 months   [birth year], M.D. Ph.D.  [facility] [address] [place], [place] Ph: [phone] Fax: [phone]  CC:  [phone], FNP [address] [place],  [place]

## 2020-04-07 LAB — COMPREHENSIVE METABOLIC PANEL
ALT: 9 IU/L (ref 0–32)
AST: 15 IU/L (ref 0–40)
Albumin/Globulin Ratio: 2.4 — ABNORMAL HIGH (ref 1.2–2.2)
Albumin: 5 g/dL — ABNORMAL HIGH (ref 3.8–4.8)
Alkaline Phosphatase: 57 IU/L (ref 48–121)
BUN/Creatinine Ratio: 17 (ref 12–28)
BUN: 13 mg/dL (ref 8–27)
Bilirubin Total: 0.4 mg/dL (ref 0.0–1.2)
CO2: 27 mmol/L (ref 20–29)
Calcium: 9.8 mg/dL (ref 8.7–10.3)
Chloride: 103 mmol/L (ref 96–106)
Creatinine, Ser: 0.76 mg/dL (ref 0.57–1.00)
GFR calc Af Amer: 96 mL/min/{1.73_m2} (ref >59)
GFR calc non Af Amer: 83 mL/min/{1.73_m2} (ref >59)
Globulin, Total: 2.1 g/dL (ref 1.5–4.5)
Glucose: 92 mg/dL (ref 65–99)
Potassium: 4.6 mmol/L (ref 3.5–5.2)
Sodium: 143 mmol/L (ref 134–144)
Total Protein: 7.1 g/dL (ref 6.0–8.5)

## 2020-04-07 LAB — CBC WITH DIFFERENTIAL/PLATELET
Basophils Absolute: 0.1 10*3/uL (ref 0.0–0.2)
Basos: 1 %
EOS (ABSOLUTE): 0.1 10*3/uL (ref 0.0–0.4)
Eos: 1 %
Hematocrit: 42.1 % (ref 34.0–46.6)
Hemoglobin: 13.8 g/dL (ref 11.1–15.9)
Immature Grans (Abs): 0 10*3/uL (ref 0.0–0.1)
Immature Granulocytes: 0 %
Lymphocytes Absolute: 1 10*3/uL (ref 0.7–3.1)
Lymphs: 19 %
MCH: 30 pg (ref 26.6–33.0)
MCHC: 32.8 g/dL (ref 31.5–35.7)
MCV: 92 fL (ref 79–97)
Monocytes Absolute: 0.4 10*3/uL (ref 0.1–0.9)
Monocytes: 8 %
Neutrophils Absolute: 3.7 10*3/uL (ref 1.4–7.0)
Neutrophils: 71 %
Platelets: 292 10*3/uL (ref 150–450)
RBC: 4.6 x10E6/uL (ref 3.77–5.28)
RDW: 13.1 % (ref 11.7–15.4)
WBC: 5.2 10*3/uL (ref 3.4–10.8)

## 2020-04-07 LAB — VITAMIN B12: Vitamin B-12: 410 pg/mL (ref 232–1245)

## 2020-04-07 LAB — SEDIMENTATION RATE: Sed Rate: 2 mm/hr (ref 0–40)

## 2020-04-07 LAB — TSH: TSH: 2.18 u[IU]/mL (ref 0.450–4.500)

## 2020-04-07 LAB — ANA W/REFLEX IF POSITIVE: Anti Nuclear Antibody (ANA): NEGATIVE

## 2020-04-07 LAB — RPR: RPR Ser Ql: NONREACTIVE

## 2020-04-07 LAB — C-REACTIVE PROTEIN: CRP: <1 mg/L (ref 0–10)

## 2020-04-10 ENCOUNTER — Telehealth: Payer: Self-pay | Admitting: Neurology

## 2020-04-10 NOTE — Telephone Encounter (Signed)
LVM for pt to call back about scheduling mri  [employer] Auth: [id number] (exp. 04/07/20 to 10/03/20)

## 2020-04-11 ENCOUNTER — Telehealth: Payer: Self-pay

## 2020-04-11 NOTE — Telephone Encounter (Signed)
I spoke with the patient she is scheduled at [facility] for 04/12/20. 

## 2020-04-11 NOTE — Telephone Encounter (Signed)
-----   Message from Levert Feinstein, MD sent at 04/10/2020 12:07 PM EDT ----- Please call patient f no significant abnormality on laboratory evaluations

## 2020-04-11 NOTE — Telephone Encounter (Signed)
Pt called and vm was left ( ok per dpr) advising of results. Pt was advised to call back if she had questions.

## 2020-04-12 ENCOUNTER — Ambulatory Visit: Payer: BC Managed Care – PPO

## 2020-04-12 DIAGNOSIS — G3184 Mild cognitive impairment, so stated: Secondary | ICD-10-CM

## 2020-04-17 ENCOUNTER — Telehealth: Payer: Self-pay | Admitting: Neurology

## 2020-04-17 NOTE — Telephone Encounter (Signed)
-----   Message from Lilla Shook, RN sent at 04/17/2020 12:46 PM EDT -----  ----- Message ----- From: Micki Riley, MD Sent: 04/14/2020  11:21 AM EDT To: Levert Feinstein, MD

## 2020-04-17 NOTE — Telephone Encounter (Signed)
  I called the patient.  MRI of the brain shows mild small vessel disease although there is a larger lesion in the left frontal area.  The degree of changes are probably not the explanation for the memory issues reported by the patient.  MRI brain 04/14/20:  IMPRESSION: Abnormal MRI scan of the brain without contrast showing showing nonspecific T2/FLAIR white matter hyperintensities likely from small vessel disease.

## 2020-04-25 ENCOUNTER — Ambulatory Visit: Payer: BC Managed Care – PPO | Admitting: Neurology

## 2020-05-15 NOTE — Telephone Encounter (Signed)
Pt's daughter Sherron Monday, not on DPR called stating the pt has more questions about her MRI results that she is needing to ask. Please advise.

## 2020-05-15 NOTE — Telephone Encounter (Signed)
I spoke to the patient directly and provided her with the information below. She verbalized understanding but would also like to come in for an earlier appt to review with Dr. Terrace Arabia. Her appt has been moved up. She plans to update her DPR to include her children at the next visit.

## 2020-05-15 NOTE — Telephone Encounter (Signed)
   IMPRESSION: Abnormal MRI scan of the brain without contrast showing showing nonspecific T2/FLAIR white matter hyperintensities likely from small vessel disease.  Please call patient's daughter, MRI showed   supratentorium small vessel disease,  which is not acute, this is related to her aging,  If her daughter still has more questions, may consider move up her appointment to review films together with me

## 2020-06-22 ENCOUNTER — Ambulatory Visit: Payer: BC Managed Care – PPO | Admitting: Neurology

## 2020-06-22 ENCOUNTER — Encounter: Payer: Self-pay | Admitting: Neurology

## 2020-06-22 ENCOUNTER — Telehealth: Payer: Self-pay | Admitting: Neurology

## 2020-06-22 VITALS — BP 152/92 | HR 72 | Ht 65.0 in | Wt 115.5 lb

## 2020-06-22 DIAGNOSIS — G3184 Mild cognitive impairment, so stated: Secondary | ICD-10-CM | POA: Diagnosis not present

## 2020-06-22 DIAGNOSIS — F32A Depression, unspecified: Secondary | ICD-10-CM | POA: Insufficient documentation

## 2020-06-22 DIAGNOSIS — I679 Cerebrovascular disease, unspecified: Secondary | ICD-10-CM

## 2020-06-22 MED ORDER — CITALOPRAM HYDROBROMIDE 10 MG PO TABS: 10.0000 mg | ORAL_TABLET | Freq: Every day | ORAL | 11 refills | Status: DC

## 2020-06-22 NOTE — Progress Notes (Signed)
HISTORICAL  Kristen Norman is a 64 year old female, seen in request by primary care nurse practitioner Christa See for evaluation of memory loss, initial evaluation was on April 06, 2020 with her husband  I reviewed and summarized the referring note. 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, is very good with numbers, both her parents suffer dementia in their elderly age  Around 2020, she was noted to have gradual onset of memory loss, is difficult for her to focus to get her measurement right, because of that, it is very hesitant for her to pick up a new project, tends to repeat her stories, she still driving, keep her hospital can balance without much difficulty, she is a self aware of her difficulties, become sensitive,  Her memory symptoms is also noted by her daughter, and her college roommates  UPDATE Jun 22 2020: She is accompanied by her husband and daughter at today's clinical visit, continue have slow decline memory loss, also complains of mild depression, tends to take frequent notes, which is very different from her baseline  We personally reviewed MRI of the brain in August 2021, scattered supratentorium small vessel disease, largest one was 1.9 x 1 cm in the left frontal periventricular region, most likely small vessel disease, mild generalized atrophy  Laboratory evaluation showed normal CMP, CBC, ESR, C-reactive protein, ANA, TSH, RPR, B12   REVIEW OF SYSTEMS: Full 14 system review of systems performed and notable only for as above All other review of systems were negative.  ALLERGIES: Allergies  Allergen Reactions  . Sulfa Antibiotics Hives    HOME MEDICATIONS: Current Outpatient Medications  Medication Sig Dispense Refill  . Multiple Vitamins-Minerals (MULTIVITAMIN WOMEN PO) Take 1 tablet by mouth daily.    . vitamin B-12 (CYANOCOBALAMIN) 1000 MCG tablet Take 1,000 mcg by mouth  daily.     No current facility-administered medications for this visit.    PAST MEDICAL HISTORY: Past Medical History:  Diagnosis Date  . Memory changes     PAST SURGICAL HISTORY: Past Surgical History:  Procedure Laterality Date  . TUBAL LIGATION      FAMILY HISTORY: Family History  Problem Relation Age of Onset  . Breast cancer Mother        2009  . Hypertension Mother   . Hypertension Maternal Grandmother   . Heart attack Paternal Grandfather   . Diabetes Paternal Grandfather   . Dementia Father     SOCIAL HISTORY: Social History   Socioeconomic History  . Marital status: Married    Spouse name: Not on file  . Number of children: 3  . Years of education: college  . Highest education level: Not on file  Occupational History  . Occupation: retired   Tobacco Use  . Smoking status: Never Smoker  . Smokeless tobacco: Never Used  Vaping Use  . Vaping Use: Never used  Substance and Sexual Activity  . Alcohol use: Yes    Comment: occasional  . Drug use: Never  . Sexual activity: Not Currently    Comment: 1st intercourse- 20's, partners- 1, married- 62   Other Topics Concern  . Not on file  Social History Narrative   Lives with husband.   Right-handed.   Occasional use of caffeine.   Social Determinants of Health   Financial Resource Strain:   . Difficulty of Paying Living Expenses: Not on file  Food Insecurity:   . Worried About  Running Out of Food in the Last Year: Not on file  . Ran Out of Food in the Last Year: Not on file  Transportation Needs:   . Lack of Transportation (Medical): Not on file  . Lack of Transportation (Non-Medical): Not on file  Physical Activity:   . Days of Exercise per Week: Not on file  . Minutes of Exercise per Session: Not on file  Stress:   . Feeling of Stress : Not on file  Social Connections:   . Frequency of Communication with Friends and Family: Not on file  . Frequency of Social Gatherings with Friends and Family:  Not on file  . Attends Religious Services: Not on file  . Active Member of Clubs or Organizations: Not on file  . Attends Archivist Meetings: Not on file  . Marital Status: Not on file  Intimate Partner Violence:   . Fear of Current or Ex-Partner: Not on file  . Emotionally Abused: Not on file  . Physically Abused: Not on file  . Sexually Abused: Not on file     PHYSICAL EXAM   Vitals:   06/22/20 1307  BP: (!) 152/92  Pulse: 72  Weight: 115 lb 8 oz (52.4 kg)  Height: $Remove'5\' 5"'TMcqHOg$  (1.651 m)   Not recorded     Body mass index is 19.22 kg/m.  PHYSICAL EXAMNIATION:  Gen: NAD, conversant, well nourised, well groomed                     Cardiovascular: Regular rate rhythm, no peripheral edema, warm, nontender. Eyes: Conjunctivae clear without exudates or hemorrhage Neck: Supple, no carotid bruits. Pulmonary: Clear to auscultation bilaterally   NEUROLOGICAL EXAM:  MENTAL STATUS: MMSE - Mini Mental State Exam 04/06/2020  Orientation to time 5  Orientation to Place 5  Registration 3  Attention/ Calculation 5  Recall 1  Language- name 2 objects 2  Language- repeat 1  Language- follow 3 step command 3  Language- read & follow direction 1  Write a sentence 1  Copy design 1  Total score 28  animal naming   CRANIAL NERVES: CN II: Visual fields are full to confrontation. Pupils are round equal and briskly reactive to light. CN III, IV, VI: extraocular movement are normal. No ptosis. CN V: Facial sensation is intact to light touch CN VII: Face is symmetric with normal eye closure  CN VIII: Hearing is normal to causal conversation. CN IX, X: Phonation is normal. CN XI: Head turning and shoulder shrug are intact  MOTOR: There is no pronator drift of out-stretched arms. Muscle bulk and tone are normal. Muscle strength is normal.  REFLEXES: Reflexes are 2+ and symmetric at the biceps, triceps, knees, and ankles. Plantar responses are flexor.  SENSORY: Intact to  light touch, pinprick and vibratory sensation are intact in fingers and toes.  COORDINATION: There is no trunk or limb dysmetria noted.  GAIT/STANCE: Posture is normal. Gait is steady with normal steps, base, arm swing, and turning. Heel and toe walking are normal. Tandem gait is normal.  Romberg is absent.   DIAGNOSTIC DATA (LABS, IMAGING, TESTING) - I reviewed patient records, labs, notes, testing and imaging myself where available.   ASSESSMENT AND PLAN  Kristen Norman is a 64 y.o. female   Mild Cognitive Impairment  Mini-Mental Status Examination was 91/30, missed 2 out of 3 recall  MRI of brain showed left frontal subcortical small vessel disease  Laboratory showed no treatable etiology  Referred to neuropsychiatric evaluation  Cerebral vascular disease  Start aspirin 81 mg daily  Complete evaluation with echocardiogram  Ultrasound of carotid artery   Depression  Starting Celexa 10 mg daily   Marcial Pacas, M.D. Ph.D.  [facility] [address] [place], [place] [address] Ph: [phone] Fax: [phone]  CC:  Christa See, [place],   [id number]

## 2020-06-22 NOTE — Telephone Encounter (Signed)
She lost her after visit summary from today's appt. She needed to confirm her EEG date and time. A psychology referral was placed today and she is aware to expect a call for scheduling.

## 2020-06-22 NOTE — Telephone Encounter (Signed)
Pt called, lost the paper with all my appts on it. Could the nurse contact me to confirm the psychologist appt?

## 2020-06-28 ENCOUNTER — Other Ambulatory Visit: Payer: Self-pay

## 2020-06-28 ENCOUNTER — Ambulatory Visit: Payer: BC Managed Care – PPO | Admitting: Diagnostic Neuroimaging

## 2020-06-28 DIAGNOSIS — G3184 Mild cognitive impairment, so stated: Secondary | ICD-10-CM

## 2020-06-28 DIAGNOSIS — R410 Disorientation, unspecified: Secondary | ICD-10-CM

## 2020-06-28 DIAGNOSIS — I679 Cerebrovascular disease, unspecified: Secondary | ICD-10-CM

## 2020-07-03 ENCOUNTER — Encounter: Payer: Self-pay | Admitting: *Deleted

## 2020-07-03 ENCOUNTER — Telehealth: Payer: Self-pay | Admitting: *Deleted

## 2020-07-03 NOTE — Telephone Encounter (Signed)
Patient sent message via "patient schedule" portal in my chart asking for her EEG results. I replied advising it may take 1-2 weeks, and I would let Dr Terrace Arabia know she is asking.

## 2020-07-10 ENCOUNTER — Ambulatory Visit: Payer: BC Managed Care – PPO | Admitting: Neurology

## 2020-07-12 NOTE — Telephone Encounter (Signed)
Please call patient for normal EEG 

## 2020-07-12 NOTE — Procedures (Signed)
   HISTORY: 64 years old female presented with memory loss.  TECHNIQUE:  This is a routine 16 channel EEG recording with one channel devoted to a limited EKG recording.  It was performed during wakefulness, drowsiness and asleep.  Hyperventilation and photic stimulation were performed as activating procedures.  There are minimum muscle and movement artifact noted.  Upon maximum arousal, posterior dominant waking rhythm consistent of rhythmic alpha range activity, with frequency of 9 Hz. Activities are symmetric over the bilateral posterior derivations and attenuated with eye opening.  Hyperventilation produced mild/moderate buildup with higher amplitude and the slower activities noted.  Photic stimulation did not alter the tracing.  During EEG recording, patient developed drowsiness and no deeper stage of sleep was achieved.  During EEG recording, there was no epileptiform discharge noted.  EKG demonstrate sinus rhythm, with heart rate of 76 bpm.  CONCLUSION: This is a  normal awake EEG.  There is no electrodiagnostic evidence of epileptiform discharge.  Levert Feinstein, M.D. Ph.D.  [facility] [address] [place], [place] [address] Phone: [phone] Fax:      [phone]

## 2020-07-13 NOTE — Telephone Encounter (Signed)
Left patient a detailed message, with results, on voicemail (ok per DPR).  Provided our number to call back with any questions.  

## 2020-07-19 ENCOUNTER — Ambulatory Visit (HOSPITAL_COMMUNITY): Payer: BC Managed Care – PPO

## 2020-07-27 IMAGING — MG DIGITAL SCREENING BILATERAL MAMMOGRAM WITH TOMO AND CAD
8 series · 9 of 24 positions shown · non-contrast
Comparison: Previous exam(s).

CLINICAL DATA: Screening.

EXAM:
DIGITAL SCREENING BILATERAL MAMMOGRAM WITH TOMO AND CAD

[R CC synth-2D]
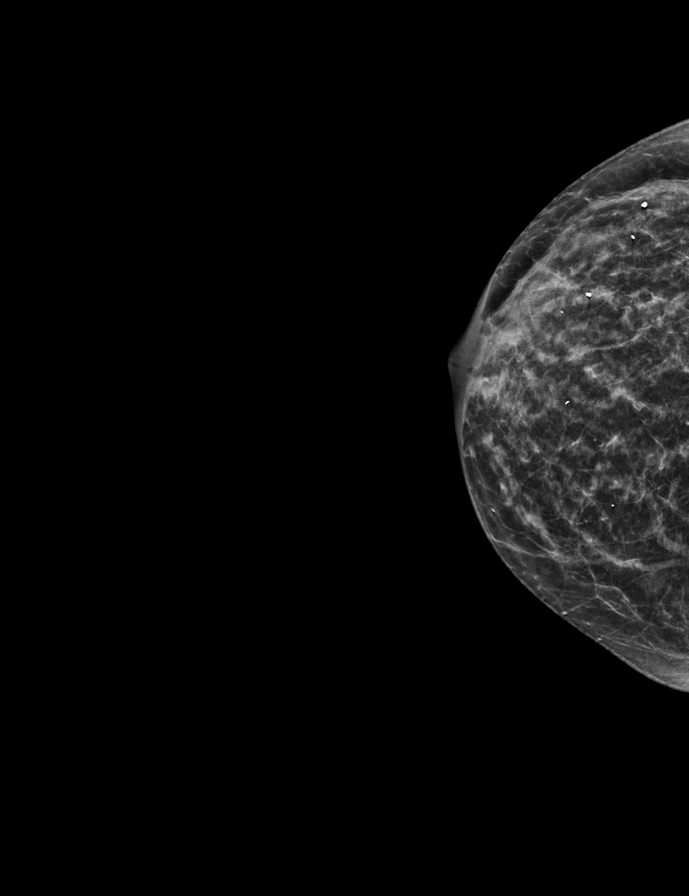

[L CC synth-2D]
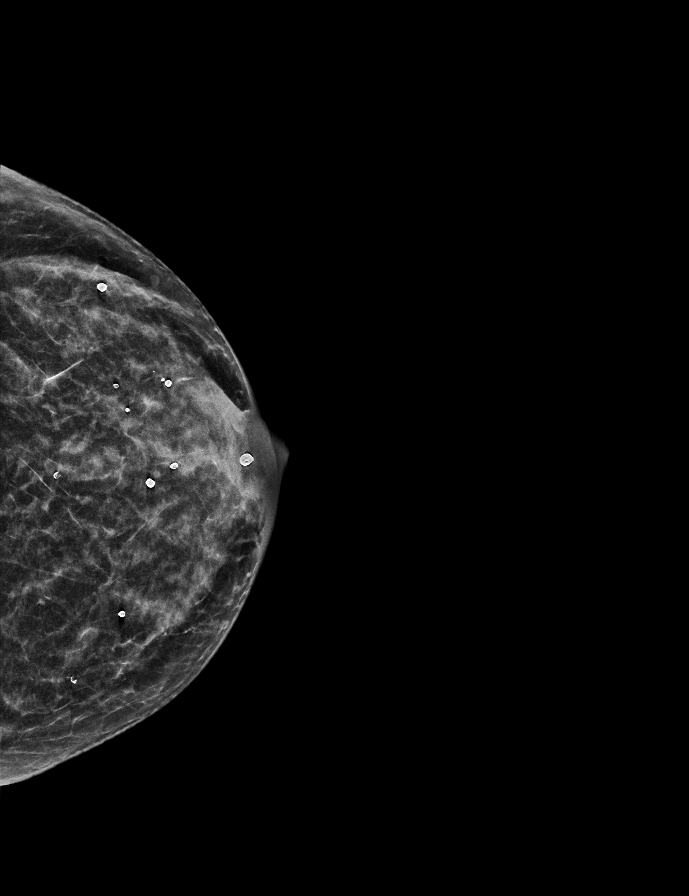

[R MLO synth-2D]
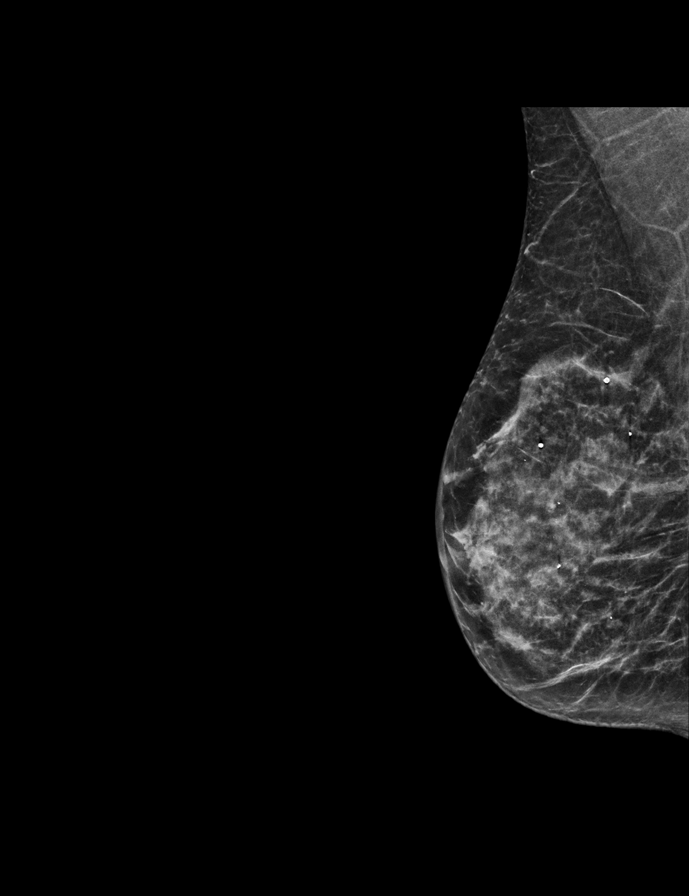

[L MLO synth-2D]
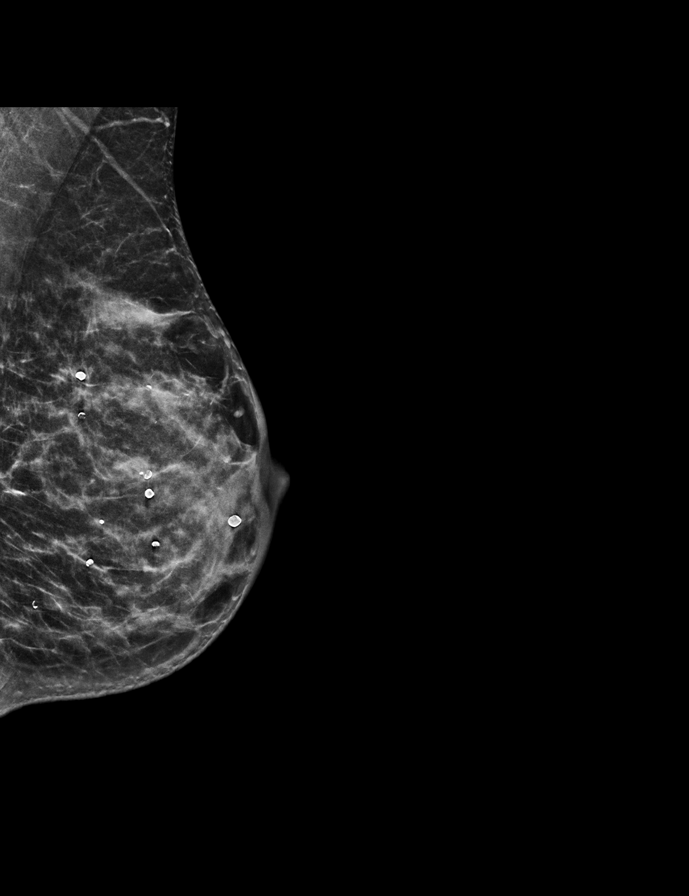

[R MLO tomo · 2 of 49 frames shown]
[frame 16/49]
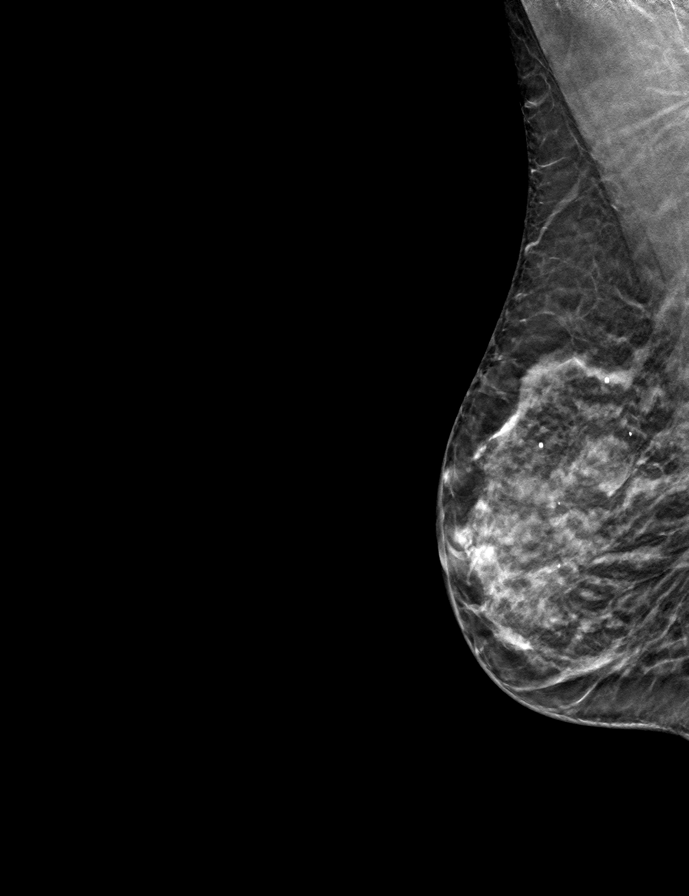
[frame 25/49]
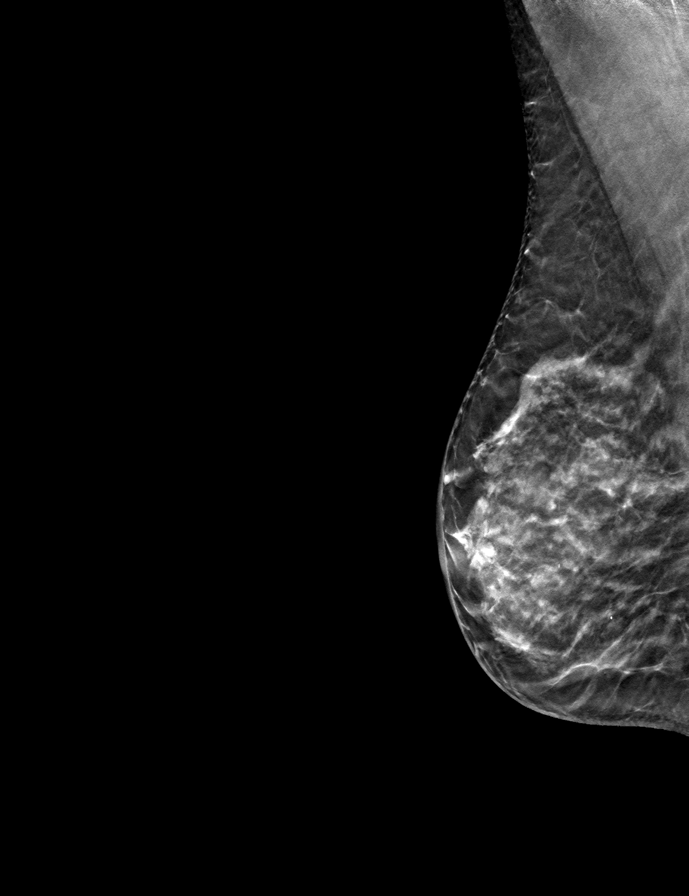

[L MLO tomo · tomo slice 25/50.0]
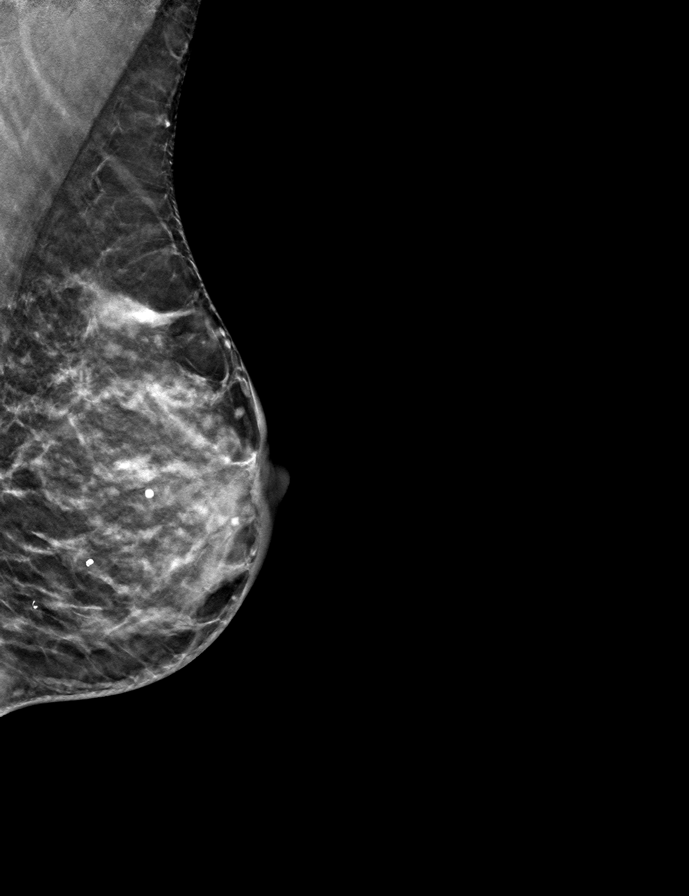

[R CC tomo · tomo slice 21/41.0]
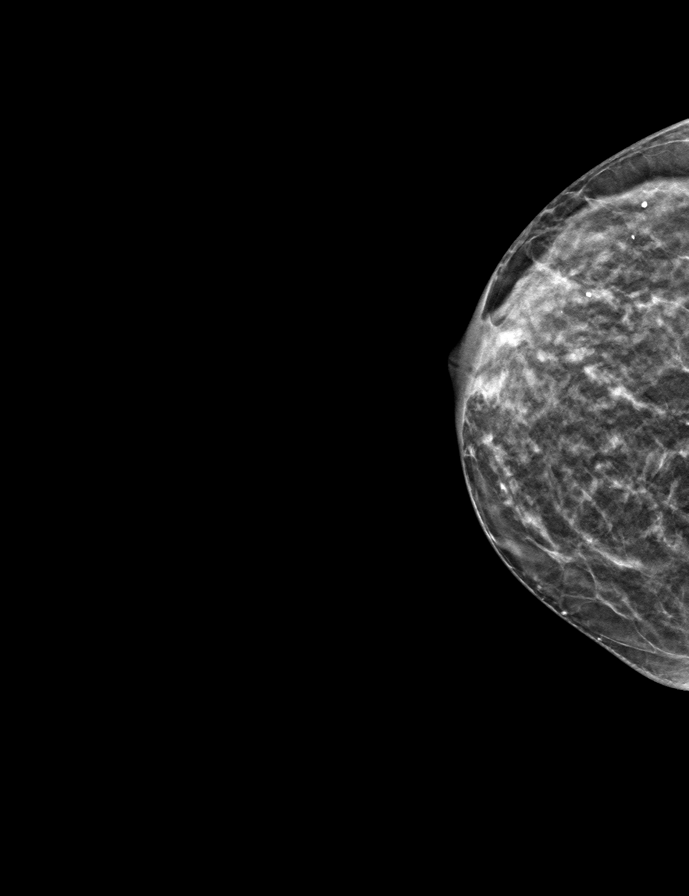

[L CC tomo · tomo slice 23/44.0]
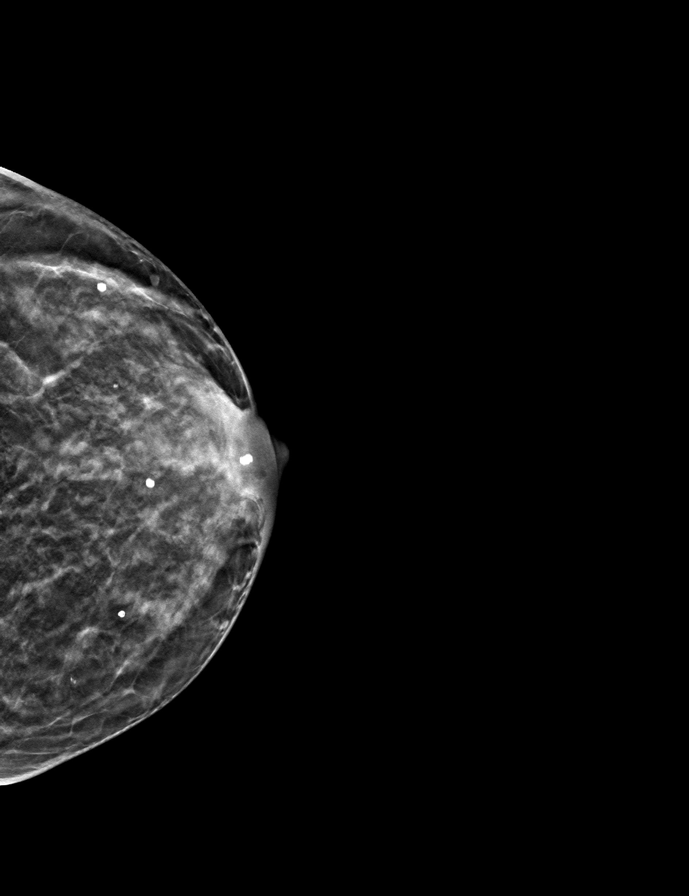

[9 of 24 positions shown; findings below may reference images not displayed]

ACR Breast Density Category c: The breast tissue is heterogeneously
dense, which may obscure small masses.
FINDINGS: There are no findings suspicious for malignancy. Images were
processed with CAD.
IMPRESSION: No mammographic evidence of malignancy. A result letter of this
screening mammogram will be mailed directly to the patient.

RECOMMENDATION:
Screening mammogram in one year. (Code:FT-U-LHB)

BI-RADS CATEGORY  1: Negative.

## 2020-11-28 ENCOUNTER — Telehealth: Payer: Self-pay | Admitting: Neurology

## 2020-11-28 NOTE — Telephone Encounter (Signed)
R/s patient due to MD being out of office.

## 2020-12-21 ENCOUNTER — Ambulatory Visit: Payer: BC Managed Care – PPO | Admitting: Neurology

## 2020-12-28 ENCOUNTER — Ambulatory Visit: Payer: Medicare PPO | Admitting: Neurology

## 2020-12-28 ENCOUNTER — Encounter: Payer: Self-pay | Admitting: Neurology

## 2020-12-28 VITALS — BP 152/92 | HR 91 | Ht 65.0 in | Wt 116.0 lb

## 2020-12-28 DIAGNOSIS — I679 Cerebrovascular disease, unspecified: Secondary | ICD-10-CM | POA: Diagnosis not present

## 2020-12-28 DIAGNOSIS — F0390 Unspecified dementia without behavioral disturbance: Secondary | ICD-10-CM | POA: Diagnosis not present

## 2020-12-28 MED ORDER — MEMANTINE HCL 10 MG PO TABS: 10.0000 mg | ORAL_TABLET | Freq: Two times a day (BID) | ORAL | 11 refills | Status: DC

## 2020-12-28 NOTE — Progress Notes (Signed)
ASSESSMENT AND PLAN  Kristen Norman is a 65 y.o. female   Mild Cognitive Impairment  Mini-Mental Status Examination was 28/30, missed 2 out of 3 recall in October 2021, MoCA examination is 15 out of 30 today,  MRI of brain showed left frontal subcortical small vessel disease  Laboratory showed no treatable etiology  She was referred to neuropsychiatric evaluation, but was never completed,  She wants to try medications, will start Namenda 10 mg twice a day,  Emphasized importance of moderate exercise  Cerebral vascular disease  Continue aspirin 81 mg daily    DIAGNOSTIC DATA (LABS, IMAGING, TESTING) - I reviewed patient records, labs, notes, testing and imaging myself where available.  PHYSICAL EXAM   Vitals:   12/28/20 1312  BP: (!) 152/92  Pulse: 91  Weight: 116 lb (52.6 kg)  Height: $Remove'5\' 5"'[id number]$  (1.651 m)   Not recorded     Body mass index is 19.3 kg/m.  PHYSICAL EXAMNIATION:  Gen: NAD, conversant, well nourised, well groomed        NEUROLOGICAL EXAM:  MENTAL STATUS: MMSE - Mini Mental State Exam 04/06/2020  Orientation to time 5  Orientation to Place 5  Registration 3  Attention/ Calculation 5  Recall 1  Language- name 2 objects 2  Language- repeat 1  Language- follow 3 step command 3  Language- read & follow direction 1  Write a sentence 1  Copy design 1  Total score 28  animal naming   CRANIAL NERVES: CN II: Visual fields are full to confrontation. Pupils are round equal and briskly reactive to light. CN III, IV, VI: extraocular movement are normal. No ptosis. CN V: Facial sensation is intact to light touch CN VII: Face is symmetric with normal eye closure  CN VIII: Hearing is normal to causal conversation. CN IX, X: Phonation is normal. CN XI: Head turning and shoulder shrug are intact  MOTOR: Muscle strength is normal.  REFLEXES: Reflexes are 2+ and symmetric at the biceps, triceps, knees, and ankles. Plantar responses are  flexor.  SENSORY: Intact to light touch, pinprick and vibratory sensation are intact in fingers and toes.  COORDINATION: There is no trunk or limb dysmetria noted.  GAIT/STANCE: Posture is normal. Gait is steady    HISTORICAL  Kristen Norman is a 65 year old female, seen in request by primary care nurse practitioner Christa See for evaluation of memory loss, initial evaluation was on April 06, 2020 with her husband  I reviewed and summarized the referring note. 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, is very good with numbers, both her parents suffer dementia in their elderly age  Around 2020, she was noted to have gradual onset of memory loss, is difficult for her to focus to get her measurement right, because of that, it is very hesitant for her to pick up a new project, tends to repeat her stories, she still driving, keep her hospital can balance without much difficulty, she is a self aware of her difficulties, become sensitive,  Her memory symptoms is also noted by her daughter, and her college roommates  UPDATE Jun 22 2020: She is accompanied by her husband and daughter at today's clinical visit, continue have slow decline memory loss, also complains of mild depression, tends to take frequent notes, which is very different from her baseline  We personally reviewed MRI of the brain in August 2021, scattered supratentorium small vessel disease, largest  one was 1.9 x 1 cm in the left frontal periventricular region, most likely small vessel disease, mild generalized atrophy  Laboratory evaluation showed normal CMP, CBC, ESR, C-reactive protein, ANA, TSH, RPR, B12  UPDATE December 28 2020: She drove herself to clinic today, without any difficulty finding away, she continue complains worsening memory loss, MoCA examination is only 15 out of 30 today, she is still enjoying her sewing, doing some projects  for her church, also enjoying reading, walk at least couple miles each day, denies gait abnormality, sleeping well, has good appetite, denies significant depression anxiety, never tried Celexa that was prescribed in October 2021, but she does not want to try some medicine to help her with memory difficulties, she tends to feel frustrated easily, with herself, and with her family members sometimes,   REVIEW OF SYSTEMS: Full 14 system review of systems performed and notable only for as above All other review of systems were negative.  ALLERGIES: Allergies  Allergen Reactions  . Sulfa Antibiotics Hives    HOME MEDICATIONS: Current Outpatient Medications  Medication Sig Dispense Refill  . citalopram (CELEXA) 10 MG tablet Take 1 tablet (10 mg total) by mouth daily. 30 tablet 11  . Multiple Vitamins-Minerals (MULTIVITAMIN WOMEN PO) Take 1 tablet by mouth daily.    . vitamin B-12 (CYANOCOBALAMIN) 1000 MCG tablet Take 1,000 mcg by mouth daily.     No current facility-administered medications for this visit.    PAST MEDICAL HISTORY: Past Medical History:  Diagnosis Date  . Memory changes     PAST SURGICAL HISTORY: Past Surgical History:  Procedure Laterality Date  . TUBAL LIGATION      FAMILY HISTORY: Family History  Problem Relation Age of Onset  . Breast cancer Mother        2009  . Hypertension Mother   . Hypertension Maternal Grandmother   . Heart attack Paternal Grandfather   . Diabetes Paternal Grandfather   . Dementia Father     SOCIAL HISTORY: Social History   Socioeconomic History  . Marital status: Married    Spouse name: Not on file  . Number of children: 3  . Years of education: college  . Highest education level: Not on file  Occupational History  . Occupation: retired   Tobacco Use  . Smoking status: Never Smoker  . Smokeless tobacco: Never Used  Vaping Use  . Vaping Use: Never used  Substance and Sexual Activity  . Alcohol use: Yes    Comment:  occasional  . Drug use: Never  . Sexual activity: Not Currently    Comment: 1st intercourse- 20's, partners- 1, married- 59   Other Topics Concern  . Not on file  Social History Narrative   Lives with husband.   Right-handed.   Occasional use of caffeine.   Social Determinants of Health   Financial Resource Strain: Not on file  Food Insecurity: Not on file  Transportation Needs: Not on file  Physical Activity: Not on file  Stress: Not on file  Social Connections: Not on file  Intimate Partner Violence: Not on file       Marcial Pacas, M.D. Ph.D.  [facility] [address], [place], [place] [address] Ph: [phone] Fax: [phone]  CC:  Christa See, [place],  [place] [address]

## 2021-07-03 ENCOUNTER — Ambulatory Visit: Payer: Medicare PPO | Admitting: Neurology

## 2021-07-03 ENCOUNTER — Encounter: Payer: Self-pay | Admitting: Neurology

## 2021-07-03 VITALS — BP 170/83 | HR 99 | Ht 66.0 in | Wt 110.0 lb

## 2021-07-03 DIAGNOSIS — F0390 Unspecified dementia without behavioral disturbance: Secondary | ICD-10-CM | POA: Diagnosis not present

## 2021-07-03 NOTE — Progress Notes (Signed)
ASSESSMENT AND PLAN  Kristen Norman is a 65 y.o. female   Dementia without behavior change  Slowly worsening,  MoCA examination 16/30, significant short-term memory loss  Most consistent with central nervous system degenerative disorder likely Alzheimer's disease,  MRI of brain showed left frontal subcortical small vessel disease  Laboratory showed no treatable etiology  Previously tried Namenda twice a day, per patient, did not make any difference, does not want to take any medication  Emphasized importance of moderate exercise    DIAGNOSTIC DATA (LABS, IMAGING, TESTING) - I reviewed patient records, labs, notes, testing and imaging myself where available.  PHYSICAL EXAM   Vitals:   12/28/20 1312  BP: (!) 152/92  Pulse: 91  Weight: 116 lb (52.6 kg)  Height: 5' 5" (1.651 m)   Not recorded     Body mass index is 19.3 kg/m.  PHYSICAL EXAMNIATION:  Gen: NAD, conversant, well nourised, well groomed        NEUROLOGICAL EXAM:  MENTAL STATUS: Montreal Cognitive Assessment  07/03/2021 12/28/2020  Visuospatial/ Executive (0/5) 3 2  Naming (0/3) 3 3  Attention: Read list of digits (0/2) 2 2  Attention: Read list of letters (0/1) 1 1  Attention: Serial 7 subtraction starting at 100 (0/3) 1 1  Language: Repeat phrase (0/2) 1 2  Language : Fluency (0/1) 1 1  Abstraction (0/2) 2 1  Delayed Recall (0/5) 0 0  Orientation (0/6) 2 2  Total 16 15  Adjusted Score (based on education) - 15      CRANIAL NERVES: CN II: Visual fields are full to confrontation. Pupils are round equal and briskly reactive to light. CN III, IV, VI: extraocular movement are normal. No ptosis. CN V: Facial sensation is intact to light touch CN VII: Face is symmetric with normal eye closure  CN VIII: Hearing is normal to causal conversation. CN IX, X: Phonation is normal. CN XI: Head turning and shoulder shrug are intact  MOTOR: Muscle strength is normal.  REFLEXES: Reflexes are 2+ and  symmetric at the biceps, triceps, knees, and ankles. Plantar responses are flexor.  SENSORY: Intact to light touch, pinprick and vibratory sensation are intact in fingers and toes.  COORDINATION: There is no trunk or limb dysmetria noted.  GAIT/STANCE: Posture is normal. Gait is steady    HISTORICAL  Kristen Norman is a 65 year old female, seen in request by primary care nurse practitioner Christa See for evaluation of memory loss, initial evaluation was on April 06, 2020 with her husband  I reviewed and summarized the referring note. 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, is very good with numbers, both her parents suffer dementia in their elderly age  Around 2020, she was noted to have gradual onset of memory loss, is difficult for her to focus to get her measurement right, because of that, it is very hesitant for her to pick up a new project, tends to repeat her stories, she still driving, keep check in balance without much difficulty, she is a self aware of her difficulties, become sensitive,  Her memory symptoms is also noted by her daughter, and her college roommates  UPDATE Jun 22 2020: She is accompanied by her husband and daughter at today's clinical visit, continue have slow decline memory loss, also complains of mild depression, tends to take frequent notes, which is very different from her baseline  We personally reviewed MRI of the  brain in August 2021, scattered supratentorium small vessel disease, largest one was 1.9 x 1 cm in the left frontal periventricular region, most likely small vessel disease, mild generalized atrophy  Laboratory evaluation showed normal CMP, CBC, ESR, C-reactive protein, ANA, TSH, RPR, B12  UPDATE December 28 2020: She drove herself to clinic today, without any difficulty finding away, she continue complains worsening memory loss, MoCA examination is only 15 out of  30 today, she is still enjoying her sewing, doing some projects for her church, also enjoying reading, walk at least couple miles each day, denies gait abnormality, sleeping well, has good appetite, denies significant depression anxiety, never tried Celexa that was prescribed in October 2021, but she does not want to try some medicine to help her with memory difficulties, she tends to feel frustrated easily, with herself, and with her family members sometimes,  UPDATE Jul 03 2021: She drove by her husband at clinic, alone during today's visit, overall stable, has good appetite, sleeping well, denies significant depression anxiety, no longer on SSRI, taking on small sewing project without much difficulty, walk a couple miles with her husband every day, has good social support  MoCA examination 16/30 today  She does not want to try Namenda or Aricept, previously was on Namenda 10 twice a day, has stopped taking it, did not see any difference  REVIEW OF SYSTEMS: Full 14 system review of systems performed and notable only for as above All other review of systems were negative.  ALLERGIES: Allergies  Allergen Reactions   Sulfa Antibiotics Hives    HOME MEDICATIONS: Current Outpatient Medications  Medication Sig Dispense Refill   Multiple Vitamins-Minerals (MULTIVITAMIN WOMEN PO) Take 1 tablet by mouth daily.     No current facility-administered medications for this visit.    PAST MEDICAL HISTORY: Past Medical History:  Diagnosis Date   Memory changes     PAST SURGICAL HISTORY: Past Surgical History:  Procedure Laterality Date   TUBAL LIGATION      FAMILY HISTORY: Family History  Problem Relation Age of Onset   Breast cancer Mother        2009   Hypertension Mother    Hypertension Maternal Grandmother    Heart attack Paternal Grandfather    Diabetes Paternal Grandfather    Dementia Father     SOCIAL HISTORY: Social History   Socioeconomic History   Marital status:  Married    Spouse name: Not on file   Number of children: 3   Years of education: college   Highest education level: Not on file  Occupational History   Occupation: retired   Tobacco Use   Smoking status: Never   Smokeless tobacco: Never  Vaping Use   Vaping Use: Never used  Substance and Sexual Activity   Alcohol use: Yes    Comment: occasional   Drug use: Never   Sexual activity: Not Currently    Comment: 1st intercourse- 20's, partners- 63, married- 24   Other Topics Concern   Not on file  Social History Narrative   Lives with husband.   Right-handed.   Occasional use of caffeine.   Social Determinants of Health   Financial Resource Strain: Not on file  Food Insecurity: Not on file  Transportation Needs: Not on file  Physical Activity: Not on file  Stress: Not on file  Social Connections: Not on file  Intimate Partner Violence: Not on file       Marcial Pacas, M.D. Ph.D.  [facility] [phone]  [address], [place], [place] [address] Ph: [phone] Fax: [phone]  CC:  Christa See, [place],  [place] [address]

## 2022-01-25 DIAGNOSIS — L821 Other seborrheic keratosis: Secondary | ICD-10-CM | POA: Diagnosis not present

## 2022-01-25 DIAGNOSIS — L819 Disorder of pigmentation, unspecified: Secondary | ICD-10-CM | POA: Diagnosis not present

## 2022-01-25 DIAGNOSIS — D1801 Hemangioma of skin and subcutaneous tissue: Secondary | ICD-10-CM | POA: Diagnosis not present

## 2022-01-25 DIAGNOSIS — D2262 Melanocytic nevi of left upper limb, including shoulder: Secondary | ICD-10-CM | POA: Diagnosis not present

## 2022-01-25 DIAGNOSIS — L820 Inflamed seborrheic keratosis: Secondary | ICD-10-CM | POA: Diagnosis not present

## 2022-01-25 DIAGNOSIS — L570 Actinic keratosis: Secondary | ICD-10-CM | POA: Diagnosis not present

## 2022-01-25 DIAGNOSIS — D225 Melanocytic nevi of trunk: Secondary | ICD-10-CM | POA: Diagnosis not present

## 2023-04-14 DIAGNOSIS — L570 Actinic keratosis: Secondary | ICD-10-CM | POA: Diagnosis not present

## 2023-04-14 DIAGNOSIS — C44319 Basal cell carcinoma of skin of other parts of face: Secondary | ICD-10-CM | POA: Diagnosis not present

## 2023-04-14 DIAGNOSIS — D225 Melanocytic nevi of trunk: Secondary | ICD-10-CM | POA: Diagnosis not present

## 2023-04-14 DIAGNOSIS — L821 Other seborrheic keratosis: Secondary | ICD-10-CM | POA: Diagnosis not present

## 2023-04-14 DIAGNOSIS — I8392 Asymptomatic varicose veins of left lower extremity: Secondary | ICD-10-CM | POA: Diagnosis not present

## 2023-05-27 DIAGNOSIS — Z85828 Personal history of other malignant neoplasm of skin: Secondary | ICD-10-CM | POA: Diagnosis not present

## 2023-05-27 DIAGNOSIS — C44319 Basal cell carcinoma of skin of other parts of face: Secondary | ICD-10-CM | POA: Diagnosis not present
